# Patient Record
Sex: Female | Born: 1950 | Race: Black or African American | Hispanic: No | Marital: Single | Smoking: Former smoker
Health system: Southern US, Community
[De-identification: ages and names within clinical notes are randomized; demographics above are authoritative.]

## PROBLEM LIST (undated history)

## (undated) DIAGNOSIS — E785 Hyperlipidemia, unspecified: Secondary | ICD-10-CM

## (undated) DIAGNOSIS — R7303 Prediabetes: Secondary | ICD-10-CM

---

## 2014-10-27 ENCOUNTER — Ambulatory Visit: Payer: Self-pay | Admitting: Physician Assistant

## 2017-06-09 ENCOUNTER — Encounter: Payer: Self-pay | Admitting: Emergency Medicine

## 2017-06-09 ENCOUNTER — Ambulatory Visit
Admission: EM | Admit: 2017-06-09 | Discharge: 2017-06-09 | Disposition: A | Discharge status: Home / Self Care | Payer: BC Managed Care – PPO | Attending: Family Medicine | Admitting: Family Medicine

## 2017-06-09 ENCOUNTER — Ambulatory Visit
Admit: 2017-06-09 | Discharge: 2017-06-09 | Disposition: A | Discharge status: Home / Self Care | Payer: BC Managed Care – PPO | Attending: Family Medicine | Admitting: Family Medicine

## 2017-06-09 ENCOUNTER — Telehealth: Payer: Self-pay | Admitting: Emergency Medicine

## 2017-06-09 DIAGNOSIS — M79661 Pain in right lower leg: Secondary | ICD-10-CM | POA: Diagnosis not present

## 2017-06-09 DIAGNOSIS — M76891 Other specified enthesopathies of right lower limb, excluding foot: Secondary | ICD-10-CM

## 2017-06-09 DIAGNOSIS — T148XXA Other injury of unspecified body region, initial encounter: Secondary | ICD-10-CM

## 2017-06-09 DIAGNOSIS — M7989 Other specified soft tissue disorders: Secondary | ICD-10-CM | POA: Insufficient documentation

## 2017-06-09 MED ORDER — MELOXICAM 15 MG PO TABS
15.0000 mg | ORAL_TABLET | Freq: Every day | ORAL | 0 refills | Status: AC
Start: 2017-06-09 — End: ?

## 2017-06-09 MED ORDER — TIZANIDINE HCL 4 MG PO CAPS: 4.0000 mg | ORAL_CAPSULE | Freq: Three times a day (TID) | ORAL | 0 refills | Status: AC

## 2017-06-09 NOTE — Telephone Encounter (Signed)
Patient notified that her [place] was negative for DVTs.  Patient verbalized understanding.

## 2017-06-09 NOTE — ED Provider Notes (Signed)
[facility]    CSN: [id number] Arrival date & time: 06/09/17  0945     History   Chief Complaint Chief Complaint  Patient presents with  . Leg Pain    HPI Isabel Castro is a 66 y.o. female.   Patient is a 66 year old black female multiple complaints the main complaint though today is she is having pain in the right leg. She reports pain in the right hamstring area and the right popliteal area. She has multiple antidotal stories of the leg locking up on her all wants causing her to have trouble ambulating getting in and out time evening having trouble forming her job at [employer]. She reports is been going on for 2-3 weeks and is only getting worse. She does report that she will did take some time off at home when he first started she wouldn't do anything she got home but just rested but once she started doing her activities again the pain started back up again. No history of previous injury to the right leg no previous history of hamstring tendon pulled or irritation before either. No known drug allergies. She states she does not have a PCP that she sees regularly she denies any medical problems. No previous surgeries she does smoke. No pertinent family medical history relevant to today's visit.  She also shows me a picture of where she's had appears to be some pustules in the right lower leg not sure this is insect bite she received when she was gardening the main thing though is pustules. Be healed there is some mild swelling but no other abnormalities   The history is provided by the patient. No language interpreter was used.  Leg Pain  Location:  Leg Time since incident:  3 weeks Injury: no   Leg location:  R leg Pain details:    Quality:  Aching and cramping   Radiates to:  R leg and RLQ   Severity:  Moderate   Onset quality:  Sudden   Timing:  Intermittent   Progression:  Waxing and waning Chronicity:  New Dislocation: no   Foreign body present:  No foreign  bodies   History reviewed. No pertinent past medical history.  There are no active problems to display for this patient.   History reviewed. No pertinent surgical history.  OB History    No data available       Home Medications    Prior to Admission medications   Medication Sig Start Date End Date Taking? Authorizing Provider  meloxicam (MOBIC) 15 MG tablet Take 1 tablet (15 mg total) by mouth daily. 06/09/17   Frederich Cha, MD  tiZANidine (ZANAFLEX) 4 MG capsule Take 1 capsule (4 mg total) by mouth 3 (three) times daily. 06/09/17   Frederich Cha, MD    Family History History reviewed. No pertinent family history.  Social History Social History  Substance Use Topics  . Smoking status: Current Every Day Smoker    Packs/day: 0.50    Types: Cigarettes  . Smokeless tobacco: Never Used  . Alcohol use Yes     Allergies   Patient has no known allergies.   Review of Systems Review of Systems  Unable to perform ROS: Other  Musculoskeletal: Positive for joint swelling and myalgias.  Skin: Negative for color change, pallor and rash.     Physical Exam Triage Vital Signs ED Triage Vitals  Enc Vitals Group     BP 06/09/17 1156 129/78     Pulse Rate  06/09/17 1156 77     Resp 06/09/17 1156 16     Temp 06/09/17 1156 98 F (36.7 C)     Temp Source 06/09/17 1156 Oral     SpO2 06/09/17 1156 100 %     Weight --      Height 06/09/17 1154 5\' 3"  (1.6 m)     Head Circumference --      Peak Flow --      Pain Score 06/09/17 1154 10     Pain Loc --      Pain Edu? --      Excl. in [place]? --    No data found.   Updated Vital Signs BP 129/78 (BP Location: Left Arm)   Pulse 77   Temp 98 F (36.7 C) (Oral)   Resp 16   Ht 5\' 3"  (1.6 m)   SpO2 100%   Visual Acuity Right Eye Distance:   Left Eye Distance:   Bilateral Distance:    Right Eye Near:   Left Eye Near:    Bilateral Near:     Physical Exam  Constitutional: She is oriented to person, place, and time. She  appears well-developed and well-nourished.  HENT:  Head: Normocephalic and atraumatic.  Eyes: Pupils are equal, round, and reactive to light.  Neck: Normal range of motion. Neck supple.  Pulmonary/Chest: Effort normal.  Musculoskeletal: She exhibits tenderness.       Right lower leg: She exhibits tenderness.       Legs: Patient has tennis of the right lateral hamstring muscle insertion into the right lower leg she also has tenderness over the right calf muscle negative Homan pulse is intact. She also has a few small pustules on the right lower leg that may benefit from insect bite but they look very healing at this time.  Neurological: She is alert and oriented to person, place, and time.  Skin: Skin is warm.  Psychiatric: She has a normal mood and affect.  Vitals reviewed.    UC Treatments / Results  Labs (all labs ordered are listed, but only abnormal results are displayed) Labs Reviewed - No data to display  EKG  EKG Interpretation None       Radiology No results found.  Procedures Procedures (including critical care time)  Medications Ordered in UC Medications - No data to display   Initial Impression / Assessment and Plan / UC Course  I have reviewed the triage vital signs and the nursing notes.  Pertinent labs & imaging results that were available during my care of the patient were reviewed by me and considered in my medical decision making (see chart for details).     We'll send her for ultrasound of the right lower leg especially over the popliteal and the right calf muscle. Do not think this is a DVT but will make sure. We'll place on Mobic 15 mg Zanaflex 1 tablet 2 times a day work note for today and tomorrow strongly recommend she follows up with her PCP in case he may need to have some physical therapy and treatment for this area of pain and tenderness  Final Clinical Impressions(s) / UC Diagnoses   Final diagnoses:  Right calf pain  Muscle strain    Hamstring tendonitis of right thigh    New Prescriptions New Prescriptions   MELOXICAM (MOBIC) 15 MG TABLET    Take 1 tablet (15 mg total) by mouth daily.   TIZANIDINE (ZANAFLEX) 4 MG CAPSULE    Take 1  capsule (4 mg total) by mouth 3 (three) times daily.     Controlled Substance Prescriptions [place] Controlled Substance Registry consulted? Not Applicable   Frederich Cha, MD 06/09/17 1311

## 2017-06-09 NOTE — ED Triage Notes (Signed)
Patient c/o pain in her right lower leg and right knee that started 2 weeks ago.  Patient denies fall or injury.

## 2017-08-03 ENCOUNTER — Other Ambulatory Visit: Payer: Self-pay | Admitting: Family Medicine

## 2017-08-03 DIAGNOSIS — Z13820 Encounter for screening for osteoporosis: Secondary | ICD-10-CM

## 2018-01-31 ENCOUNTER — Other Ambulatory Visit: Payer: Self-pay | Admitting: Family Medicine

## 2018-01-31 DIAGNOSIS — Z1231 Encounter for screening mammogram for malignant neoplasm of breast: Secondary | ICD-10-CM

## 2018-02-06 ENCOUNTER — Ambulatory Visit
Admission: RE | Admit: 2018-02-06 | Discharge: 2018-02-06 | Disposition: A | Discharge status: Home / Self Care | Payer: BC Managed Care – PPO | Source: Ambulatory Visit | Attending: Family Medicine | Admitting: Family Medicine

## 2018-02-06 ENCOUNTER — Encounter (INDEPENDENT_AMBULATORY_CARE_PROVIDER_SITE_OTHER): Payer: Self-pay

## 2018-02-06 DIAGNOSIS — Z1231 Encounter for screening mammogram for malignant neoplasm of breast: Secondary | ICD-10-CM | POA: Diagnosis present

## 2018-10-05 ENCOUNTER — Encounter: Payer: Self-pay | Admitting: *Deleted

## 2018-10-08 ENCOUNTER — Ambulatory Visit: Payer: BC Managed Care – PPO | Admitting: Anesthesiology

## 2018-10-08 ENCOUNTER — Encounter
Admission: RE | Disposition: A | Discharge status: Home / Self Care | Payer: Self-pay | Source: Home / Self Care | Attending: Unknown Physician Specialty

## 2018-10-08 ENCOUNTER — Encounter: Payer: Self-pay | Admitting: *Deleted

## 2018-10-08 ENCOUNTER — Ambulatory Visit
Admission: RE | Admit: 2018-10-08 | Discharge: 2018-10-08 | Disposition: A | Discharge status: Home / Self Care | Payer: BC Managed Care – PPO | Attending: Unknown Physician Specialty | Admitting: Unknown Physician Specialty

## 2018-10-08 DIAGNOSIS — Z79899 Other long term (current) drug therapy: Secondary | ICD-10-CM | POA: Insufficient documentation

## 2018-10-08 DIAGNOSIS — K640 First degree hemorrhoids: Secondary | ICD-10-CM | POA: Insufficient documentation

## 2018-10-08 DIAGNOSIS — F17210 Nicotine dependence, cigarettes, uncomplicated: Secondary | ICD-10-CM | POA: Diagnosis not present

## 2018-10-08 DIAGNOSIS — R7303 Prediabetes: Secondary | ICD-10-CM | POA: Insufficient documentation

## 2018-10-08 DIAGNOSIS — Z7951 Long term (current) use of inhaled steroids: Secondary | ICD-10-CM | POA: Insufficient documentation

## 2018-10-08 DIAGNOSIS — D125 Benign neoplasm of sigmoid colon: Secondary | ICD-10-CM | POA: Insufficient documentation

## 2018-10-08 DIAGNOSIS — E785 Hyperlipidemia, unspecified: Secondary | ICD-10-CM | POA: Insufficient documentation

## 2018-10-08 DIAGNOSIS — Z1211 Encounter for screening for malignant neoplasm of colon: Secondary | ICD-10-CM | POA: Diagnosis not present

## 2018-10-08 HISTORY — DX: Hyperlipidemia, unspecified: E78.5

## 2018-10-08 HISTORY — DX: Prediabetes: R73.03

## 2018-10-08 HISTORY — PX: COLONOSCOPY WITH PROPOFOL: SHX5780

## 2018-10-08 SURGERY — COLONOSCOPY WITH PROPOFOL
Anesthesia: General

## 2018-10-08 MED ORDER — FENTANYL CITRATE (PF) 100 MCG/2ML IJ SOLN
INTRAMUSCULAR | Status: AC
  Filled 2018-10-08: qty 2

## 2018-10-08 MED ORDER — SODIUM CHLORIDE 0.9 % IV SOLN
INTRAVENOUS | Status: DC
  Administered 2018-10-08 (×2): via INTRAVENOUS

## 2018-10-08 MED ORDER — PROPOFOL 10 MG/ML IV BOLUS
INTRAVENOUS | Status: AC
  Filled 2018-10-08: qty 20

## 2018-10-08 MED ORDER — PROPOFOL 500 MG/50ML IV EMUL
INTRAVENOUS | Status: DC | PRN
  Administered 2018-10-08: 140 ug/kg/min via INTRAVENOUS

## 2018-10-08 MED ORDER — MIDAZOLAM HCL 2 MG/2ML IJ SOLN
INTRAMUSCULAR | Status: AC
  Filled 2018-10-08: qty 2

## 2018-10-08 MED ORDER — MIDAZOLAM HCL 2 MG/2ML IJ SOLN
INTRAMUSCULAR | Status: DC | PRN
  Administered 2018-10-08: 2 mg via INTRAVENOUS

## 2018-10-08 MED ORDER — FENTANYL CITRATE (PF) 100 MCG/2ML IJ SOLN
INTRAMUSCULAR | Status: DC | PRN
  Administered 2018-10-08: 50 ug via INTRAVENOUS

## 2018-10-08 MED ORDER — PROPOFOL 500 MG/50ML IV EMUL
INTRAVENOUS | Status: AC
  Filled 2018-10-08: qty 50

## 2018-10-08 NOTE — Anesthesia Procedure Notes (Signed)
Date/Time: 10/08/2018 2:57 PM Performed by: Nelda Marseille, CRNA Pre-anesthesia Checklist: Patient identified, Emergency Drugs available, Suction available, Patient being monitored and Timeout performed Oxygen Delivery Method: Nasal cannula

## 2018-10-08 NOTE — H&P (Signed)
Primary Care Physician:  Zeb Comfort, MD Primary Gastroenterologist:  Dr. Vira Agar  Pre-Procedure History & Physical: HPI:  Isabel Castro is a 68 y.o. female is here for an colonoscopy.   Past Medical History:  Diagnosis Date  . Hyperlipidemia   . Pre-diabetes     History reviewed. No pertinent surgical history.  Prior to Admission medications   Medication Sig Start Date End Date Taking? Authorizing Provider  atorvastatin (LIPITOR) 10 MG tablet Take 10 mg by mouth daily.   Yes [provider]  Cyanocobalamin (VITAMIN B 12) 500 MCG TABS Take 500 mg by mouth daily.   Yes [provider]  Multiple Vitamin (MULTIVITAMIN) tablet Take 1 tablet by mouth daily.   Yes [provider]  nicotine (NICODERM CQ - DOSED IN MG/24 HOURS) 21 mg/24hr patch Place 21 mg onto the skin daily.   Yes [provider]  erythromycin ophthalmic ointment Place 1 application into the left eye at bedtime.    [provider]  fluticasone (FLONASE) 50 MCG/ACT nasal spray Place 2 sprays into both nostrils 2 (two) times daily.    [provider]  meloxicam (MOBIC) 15 MG tablet Take 1 tablet (15 mg total) by mouth daily. Patient not taking: Reported on 10/08/2018 06/09/17   Frederich Cha, MD  olopatadine (PATANOL) 0.1 % ophthalmic solution Place 1 drop into both eyes 2 (two) times daily.    [provider]  tiZANidine (ZANAFLEX) 4 MG capsule Take 1 capsule (4 mg total) by mouth 3 (three) times daily. Patient not taking: Reported on 10/08/2018 06/09/17   Frederich Cha, MD    Allergies as of 08/15/2018  . (No Known Allergies)    Family History  Problem Relation Age of Onset  . Breast cancer Neg Hx     Social History   Socioeconomic History  . Marital status: Single    Spouse name: Not on file  . Number of children: Not on file  . Years of education: Not on file  . Highest education level: Not on file  Occupational History  . Not on file   Social Needs  . Financial resource strain: Not on file  . Food insecurity:    Worry: Not on file    Inability: Not on file  . Transportation needs:    Medical: Not on file    Non-medical: Not on file  Tobacco Use  . Smoking status: Current Every Day Smoker    Packs/day: 1.00    Years: 15.00    Pack years: 15.00    Types: Cigarettes  . Smokeless tobacco: Never Used  Substance and Sexual Activity  . Alcohol use: Yes  . Drug use: Never  . Sexual activity: Not on file  Lifestyle  . Physical activity:    Days per week: Not on file    Minutes per session: Not on file  . Stress: Not on file  Relationships  . Social connections:    Talks on phone: Not on file    Gets together: Not on file    Attends religious service: Not on file    Active member of club or organization: Not on file    Attends meetings of clubs or organizations: Not on file    Relationship status: Not on file  . Intimate partner violence:    Fear of current or ex partner: Not on file    Emotionally abused: Not on file    Physically abused: Not on file    Forced  sexual activity: Not on file  Other Topics Concern  . Not on file  Social History Narrative  . Not on file    Review of Systems: See HPI, otherwise negative ROS  Physical Exam: BP (!) 129/91   Pulse 81   Temp 98.2 F (36.8 C) (Tympanic)   Resp 18   Ht 5\' 3"  (1.6 m)   Wt 77.1 kg   SpO2 100%   BMI 30.11 kg/m  General:   Alert,  pleasant and cooperative in NAD Head:  Normocephalic and atraumatic. Neck:  Supple; no masses or thyromegaly. Lungs:  Clear throughout to auscultation.    Heart:  Regular rate and rhythm. Abdomen:  Soft, nontender and nondistended. Normal bowel sounds, without guarding, and without rebound.   Neurologic:  Alert and  oriented x4;  grossly normal neurologically.  Impression/Plan: Isabel Castro is here for an colonoscopy to be performed for colon cancer screening.  Risks, benefits, limitations, and  alternatives regarding  colonoscopy have been reviewed with the patient.  Questions have been answered.  All parties agreeable.   Gaylyn Cheers, MD  10/08/2018, 2:42 PM

## 2018-10-08 NOTE — Anesthesia Post-op Follow-up Note (Signed)
Anesthesia QCDR form completed.        

## 2018-10-08 NOTE — Anesthesia Postprocedure Evaluation (Signed)
Anesthesia Post Note  Patient: Isabel Castro  Procedure(s) Performed: COLONOSCOPY WITH PROPOFOL (N/A )  Patient location during evaluation: Endoscopy Anesthesia Type: General Level of consciousness: awake and alert Pain management: pain level controlled Vital Signs Assessment: post-procedure vital signs reviewed and stable Respiratory status: spontaneous breathing, nonlabored ventilation, respiratory function stable and patient connected to nasal cannula oxygen Cardiovascular status: blood pressure returned to baseline and stable Postop Assessment: no apparent nausea or vomiting Anesthetic complications: no     Last Vitals:  Vitals:   10/08/18 1529 10/08/18 1539  BP:  (!) 118/94  Pulse:    Resp: 18   Temp:    SpO2:      Last Pain:  Vitals:   10/08/18 1539  TempSrc:   PainSc: 0-No pain                 [name]

## 2018-10-08 NOTE — Transfer of Care (Signed)
Immediate Anesthesia Transfer of Care Note  Patient: Isabel Castro  Procedure(s) Performed: COLONOSCOPY WITH PROPOFOL (N/A )  Patient Location: PACU  Anesthesia Type:General  Level of Consciousness: sedated  Airway & Oxygen Therapy: Patient Spontanous Breathing and Patient connected to nasal cannula oxygen  Post-op Assessment: Report given to RN and Post -op Vital signs reviewed and stable  Post vital signs: Reviewed and stable  Last Vitals:  Vitals Value Taken Time  BP 91/67 10/08/2018  3:20 PM  Temp 36.3 C 10/08/2018  3:19 PM  Pulse 84 10/08/2018  3:21 PM  Resp 17 10/08/2018  3:21 PM  SpO2 96 % 10/08/2018  3:21 PM  Vitals shown include unvalidated device data.  Last Pain:  Vitals:   10/08/18 1519  TempSrc: Tympanic  PainSc: Asleep         Complications: No apparent anesthesia complications

## 2018-10-08 NOTE — Anesthesia Preprocedure Evaluation (Signed)
Anesthesia Evaluation  Patient identified by MRN, date of birth, ID band Patient awake    Reviewed: Allergy & Precautions, H&P , NPO status , reviewed documented beta blocker date and time   Airway Mallampati: II  TM Distance: >3 FB Neck ROM: full    Dental  (+) Caps   Pulmonary Current Smoker,    Pulmonary exam normal        Cardiovascular Normal cardiovascular exam     Neuro/Psych    GI/Hepatic   Endo/Other    Renal/GU      Musculoskeletal   Abdominal   Peds  Hematology   Anesthesia Other Findings Past Medical History: Elevated Rheumatoid Factor No date: Hyperlipidemia No date: Pre-diabetes  History reviewed. No pertinent surgical history.  BMI    Body Mass Index:  30.11 kg/m      Reproductive/Obstetrics                             Anesthesia Physical Anesthesia Plan  ASA: III  Anesthesia Plan: General   Post-op Pain Management:    Induction: Intravenous  PONV Risk Score and Plan: Treatment may vary due to age or medical condition and TIVA  Airway Management Planned: Nasal Cannula and Natural Airway  Additional Equipment:   Intra-op Plan:   Post-operative Plan:   Informed Consent: I have reviewed the patients History and Physical, chart, labs and discussed the procedure including the risks, benefits and alternatives for the proposed anesthesia with the patient or authorized representative who has indicated his/her understanding and acceptance.   Dental Advisory Given  Plan Discussed with: CRNA  Anesthesia Plan Comments:         Anesthesia Quick Evaluation

## 2018-10-08 NOTE — Op Note (Signed)
[facility] Patient Name: Isabel Castro Procedure Date: 10/08/2018 2:40 PM MRN: [id number] Account #: [id number] Date of Birth: [birth year] Admit Type: Outpatient Age: 68 Room: [facility] ENDO ROOM 1 Gender: Female Note Status: Finalized Procedure:            Colonoscopy Indications:          Screening for colorectal malignant neoplasm Providers:            Manya Silvas, MD Referring MD:         Mcneil Sober, MD Medicines:            Propofol per Anesthesia Complications:        No immediate complications. Procedure:            Pre-Anesthesia Assessment:                       - After reviewing the risks and benefits, the patient                        was deemed in satisfactory condition to undergo the                        procedure.                       After obtaining informed consent, the colonoscope was                        passed under direct vision. Throughout the procedure,                        the patient's blood pressure, pulse, and oxygen                        saturations were monitored continuously. The                        Colonoscope was introduced through the anus and                        advanced to the the cecum, identified by appendiceal                        orifice and ileocecal valve. The colonoscopy was                        somewhat difficult due to a tortuous colon. The patient                        tolerated the procedure well. The quality of the bowel                        preparation was good. Findings:      A small polyp was found in the sigmoid colon. The polyp was sessile. The       polyp was removed with a hot snare. Resection and retrieval were       complete.      Internal hemorrhoids were found during endoscopy. The hemorrhoids were       small and Grade I (internal hemorrhoids that do not prolapse).      The exam  was otherwise without abnormality. Impression:           - One small polyp in the  sigmoid colon, removed with a                        hot snare. Resected and retrieved.                       - Internal hemorrhoids.                       - The examination was otherwise normal. Recommendation:       - Await pathology results. Manya Silvas, MD 10/08/2018 3:17:55 PM This report has been signed electronically. Number of Addenda: 0 Note Initiated On: 10/08/2018 2:40 PM Scope Withdrawal Time: 0 hours 5 minutes 58 seconds  Total Procedure Duration: 0 hours 19 minutes 44 seconds       [facility]

## 2018-10-10 LAB — SURGICAL PATHOLOGY

## 2019-07-09 ENCOUNTER — Other Ambulatory Visit: Payer: Self-pay | Admitting: Family Medicine

## 2019-07-09 DIAGNOSIS — Z1231 Encounter for screening mammogram for malignant neoplasm of breast: Secondary | ICD-10-CM

## 2019-07-23 ENCOUNTER — Ambulatory Visit
Admission: RE | Admit: 2019-07-23 | Discharge: 2019-07-23 | Disposition: A | Discharge status: Home / Self Care | Payer: BC Managed Care – PPO | Source: Ambulatory Visit | Attending: Family Medicine | Admitting: Family Medicine

## 2019-07-23 ENCOUNTER — Other Ambulatory Visit: Payer: Self-pay

## 2019-07-23 DIAGNOSIS — Z1231 Encounter for screening mammogram for malignant neoplasm of breast: Secondary | ICD-10-CM | POA: Insufficient documentation

## 2020-05-25 ENCOUNTER — Other Ambulatory Visit: Payer: Self-pay | Admitting: Gerontology

## 2020-05-25 DIAGNOSIS — Z13820 Encounter for screening for osteoporosis: Secondary | ICD-10-CM

## 2020-07-09 ENCOUNTER — Emergency Department: Payer: Medicare Other | Admitting: Anesthesiology

## 2020-07-09 ENCOUNTER — Encounter: Admission: EM | Disposition: E | Payer: Self-pay | Source: Home / Self Care | Attending: Vascular Surgery

## 2020-07-09 ENCOUNTER — Encounter: Payer: Self-pay | Admitting: Emergency Medicine

## 2020-07-09 ENCOUNTER — Emergency Department: Payer: Medicare Other

## 2020-07-09 ENCOUNTER — Other Ambulatory Visit (INDEPENDENT_AMBULATORY_CARE_PROVIDER_SITE_OTHER): Payer: Self-pay | Admitting: Vascular Surgery

## 2020-07-09 ENCOUNTER — Other Ambulatory Visit: Payer: Self-pay

## 2020-07-09 ENCOUNTER — Inpatient Hospital Stay
Admission: EM | Admit: 2020-07-09 | Discharge: 2020-08-03 | DRG: 268 | Disposition: E | Payer: Medicare Other | Attending: Vascular Surgery | Admitting: Vascular Surgery

## 2020-07-09 DIAGNOSIS — Z791 Long term (current) use of non-steroidal anti-inflammatories (NSAID): Secondary | ICD-10-CM

## 2020-07-09 DIAGNOSIS — G4733 Obstructive sleep apnea (adult) (pediatric): Secondary | ICD-10-CM | POA: Diagnosis present

## 2020-07-09 DIAGNOSIS — N170 Acute kidney failure with tubular necrosis: Secondary | ICD-10-CM | POA: Diagnosis not present

## 2020-07-09 DIAGNOSIS — I469 Cardiac arrest, cause unspecified: Secondary | ICD-10-CM | POA: Diagnosis not present

## 2020-07-09 DIAGNOSIS — R34 Anuria and oliguria: Secondary | ICD-10-CM | POA: Diagnosis not present

## 2020-07-09 DIAGNOSIS — J9602 Acute respiratory failure with hypercapnia: Secondary | ICD-10-CM | POA: Diagnosis not present

## 2020-07-09 DIAGNOSIS — I713 Abdominal aortic aneurysm, ruptured, unspecified: Secondary | ICD-10-CM

## 2020-07-09 DIAGNOSIS — Z992 Dependence on renal dialysis: Secondary | ICD-10-CM

## 2020-07-09 DIAGNOSIS — K559 Vascular disorder of intestine, unspecified: Secondary | ICD-10-CM | POA: Diagnosis not present

## 2020-07-09 DIAGNOSIS — E875 Hyperkalemia: Secondary | ICD-10-CM | POA: Diagnosis not present

## 2020-07-09 DIAGNOSIS — Z452 Encounter for adjustment and management of vascular access device: Secondary | ICD-10-CM

## 2020-07-09 DIAGNOSIS — E162 Hypoglycemia, unspecified: Secondary | ICD-10-CM

## 2020-07-09 DIAGNOSIS — E785 Hyperlipidemia, unspecified: Secondary | ICD-10-CM | POA: Diagnosis present

## 2020-07-09 DIAGNOSIS — Z6832 Body mass index (BMI) 32.0-32.9, adult: Secondary | ICD-10-CM

## 2020-07-09 DIAGNOSIS — Z978 Presence of other specified devices: Secondary | ICD-10-CM

## 2020-07-09 DIAGNOSIS — Z20822 Contact with and (suspected) exposure to covid-19: Secondary | ICD-10-CM | POA: Diagnosis present

## 2020-07-09 DIAGNOSIS — R58 Hemorrhage, not elsewhere classified: Secondary | ICD-10-CM

## 2020-07-09 DIAGNOSIS — E11649 Type 2 diabetes mellitus with hypoglycemia without coma: Secondary | ICD-10-CM | POA: Diagnosis not present

## 2020-07-09 DIAGNOSIS — E872 Acidosis, unspecified: Secondary | ICD-10-CM

## 2020-07-09 DIAGNOSIS — I714 Abdominal aortic aneurysm, without rupture, unspecified: Secondary | ICD-10-CM | POA: Diagnosis present

## 2020-07-09 DIAGNOSIS — D62 Acute posthemorrhagic anemia: Secondary | ICD-10-CM

## 2020-07-09 DIAGNOSIS — Z87891 Personal history of nicotine dependence: Secondary | ICD-10-CM

## 2020-07-09 DIAGNOSIS — E1165 Type 2 diabetes mellitus with hyperglycemia: Secondary | ICD-10-CM | POA: Diagnosis not present

## 2020-07-09 DIAGNOSIS — Z66 Do not resuscitate: Secondary | ICD-10-CM | POA: Diagnosis not present

## 2020-07-09 DIAGNOSIS — R578 Other shock: Secondary | ICD-10-CM | POA: Diagnosis not present

## 2020-07-09 DIAGNOSIS — Z7189 Other specified counseling: Secondary | ICD-10-CM

## 2020-07-09 DIAGNOSIS — K661 Hemoperitoneum: Secondary | ICD-10-CM | POA: Diagnosis present

## 2020-07-09 DIAGNOSIS — K7200 Acute and subacute hepatic failure without coma: Secondary | ICD-10-CM | POA: Diagnosis not present

## 2020-07-09 DIAGNOSIS — N179 Acute kidney failure, unspecified: Secondary | ICD-10-CM

## 2020-07-09 DIAGNOSIS — J9601 Acute respiratory failure with hypoxia: Secondary | ICD-10-CM | POA: Diagnosis not present

## 2020-07-09 DIAGNOSIS — G928 Other toxic encephalopathy: Secondary | ICD-10-CM | POA: Diagnosis not present

## 2020-07-09 DIAGNOSIS — E870 Hyperosmolality and hypernatremia: Secondary | ICD-10-CM | POA: Diagnosis not present

## 2020-07-09 DIAGNOSIS — I10 Essential (primary) hypertension: Secondary | ICD-10-CM | POA: Diagnosis present

## 2020-07-09 DIAGNOSIS — R6521 Severe sepsis with septic shock: Secondary | ICD-10-CM | POA: Diagnosis not present

## 2020-07-09 DIAGNOSIS — Z79899 Other long term (current) drug therapy: Secondary | ICD-10-CM

## 2020-07-09 DIAGNOSIS — A419 Sepsis, unspecified organism: Secondary | ICD-10-CM | POA: Diagnosis not present

## 2020-07-09 DIAGNOSIS — J9600 Acute respiratory failure, unspecified whether with hypoxia or hypercapnia: Secondary | ICD-10-CM

## 2020-07-09 DIAGNOSIS — G9340 Encephalopathy, unspecified: Secondary | ICD-10-CM

## 2020-07-09 LAB — URINALYSIS, COMPLETE (UACMP) WITH MICROSCOPIC
Bacteria, UA: NONE SEEN
Bilirubin Urine: NEGATIVE
Glucose, UA: NEGATIVE mg/dL
Hgb urine dipstick: NEGATIVE
Ketones, ur: NEGATIVE mg/dL
Leukocytes,Ua: NEGATIVE
Nitrite: NEGATIVE
Protein, ur: 30 mg/dL — AB
Specific Gravity, Urine: 1.023 (ref 1.005–1.030)
pH: 5 (ref 5.0–8.0)

## 2020-07-09 LAB — COMPREHENSIVE METABOLIC PANEL
ALT: 19 U/L (ref 0–44)
AST: 28 U/L (ref 15–41)
Albumin: 3.8 g/dL (ref 3.5–5.0)
Alkaline Phosphatase: 46 U/L (ref 38–126)
Anion gap: 18 — ABNORMAL HIGH (ref 5–15)
BUN: 30 mg/dL — ABNORMAL HIGH (ref 8–23)
CO2: 18 mmol/L — ABNORMAL LOW (ref 22–32)
Calcium: 8.9 mg/dL (ref 8.9–10.3)
Chloride: 104 mmol/L (ref 98–111)
Creatinine, Ser: 1.83 mg/dL — ABNORMAL HIGH (ref 0.44–1.00)
GFR calc non Af Amer: 28 mL/min — ABNORMAL LOW (ref >60)
Glucose, Bld: 184 mg/dL — ABNORMAL HIGH (ref 70–99)
Potassium: 4.1 mmol/L (ref 3.5–5.1)
Sodium: 140 mmol/L (ref 135–145)
Total Bilirubin: 0.8 mg/dL (ref 0.3–1.2)
Total Protein: 7 g/dL (ref 6.5–8.1)

## 2020-07-09 LAB — CBC
HCT: 31.4 % — ABNORMAL LOW (ref 36.0–46.0)
Hemoglobin: 9.7 g/dL — ABNORMAL LOW (ref 12.0–15.0)
MCH: 28 pg (ref 26.0–34.0)
MCHC: 30.9 g/dL (ref 30.0–36.0)
MCV: 90.8 fL (ref 80.0–100.0)
Platelets: 215 10*3/uL (ref 150–400)
RBC: 3.46 MIL/uL — ABNORMAL LOW (ref 3.87–5.11)
RDW: 14.1 % (ref 11.5–15.5)
WBC: 10.2 10*3/uL (ref 4.0–10.5)
nRBC: 0 % (ref 0.0–0.2)

## 2020-07-09 LAB — PROTIME-INR
INR: 1.2 (ref 0.8–1.2)
Prothrombin Time: 14.8 seconds (ref 11.4–15.2)

## 2020-07-09 LAB — LACTIC ACID, PLASMA
Lactic Acid, Venous: 6.5 mmol/L (ref 0.5–1.9)
Lactic Acid, Venous: 9.1 mmol/L (ref 0.5–1.9)

## 2020-07-09 LAB — ABO/RH: ABO/RH(D): A POS

## 2020-07-09 LAB — LIPASE, BLOOD: Lipase: 40 U/L (ref 11–51)

## 2020-07-09 LAB — APTT: aPTT: 29 seconds (ref 24–36)

## 2020-07-09 SURGERY — INSERTION, ENDOVASCULAR STENT GRAFT, AORTA, ABDOMINAL
Anesthesia: General

## 2020-07-09 MED ORDER — ONDANSETRON 4 MG PO TBDP: 4.0000 mg | ORAL_TABLET | Freq: Once | ORAL | Status: DC | PRN

## 2020-07-09 MED ORDER — CHLORHEXIDINE GLUCONATE CLOTH 2 % EX PADS: 6.0000 | MEDICATED_PAD | Freq: Once | CUTANEOUS | Status: DC

## 2020-07-09 MED ORDER — MORPHINE SULFATE (PF) 2 MG/ML IV SOLN
2.0000 mg | INTRAVENOUS | Status: DC | PRN
  Administered 2020-07-09: 2 mg via INTRAVENOUS
  Filled 2020-07-09: qty 1

## 2020-07-09 MED ORDER — ONDANSETRON HCL 4 MG/2ML IJ SOLN
INTRAMUSCULAR | Status: AC
  Administered 2020-07-09: 4 mg via INTRAVENOUS
  Filled 2020-07-09: qty 2

## 2020-07-09 MED ORDER — CEFAZOLIN SODIUM-DEXTROSE 1-4 GM/50ML-% IV SOLN: 1.0000 g | INTRAVENOUS | Status: DC

## 2020-07-09 MED ORDER — SODIUM CHLORIDE 0.9 % IV SOLN: 10.0000 mL/h | Freq: Once | INTRAVENOUS | Status: DC

## 2020-07-09 MED ORDER — ONDANSETRON HCL 4 MG/2ML IJ SOLN
INTRAMUSCULAR | Status: AC
  Filled 2020-07-09: qty 2

## 2020-07-09 MED ORDER — SODIUM CHLORIDE 0.9 % IV SOLN: INTRAVENOUS | Status: DC

## 2020-07-09 MED ORDER — PHENYLEPHRINE HCL (PRESSORS) 10 MG/ML IV SOLN
INTRAVENOUS | Status: AC
  Filled 2020-07-09: qty 1

## 2020-07-09 MED ORDER — IOHEXOL 9 MG/ML PO SOLN
1000.0000 mL | Freq: Once | ORAL | Status: AC | PRN
  Administered 2020-07-09: 500 mL via ORAL

## 2020-07-09 MED ORDER — CEFAZOLIN SODIUM-DEXTROSE 1-4 GM/50ML-% IV SOLN: 1.0000 g | Freq: Once | INTRAVENOUS | Status: DC

## 2020-07-09 MED ORDER — SUCCINYLCHOLINE CHLORIDE 200 MG/10ML IV SOSY
PREFILLED_SYRINGE | INTRAVENOUS | Status: AC
  Filled 2020-07-09: qty 10

## 2020-07-09 MED ORDER — FENTANYL CITRATE (PF) 100 MCG/2ML IJ SOLN
INTRAMUSCULAR | Status: AC
  Filled 2020-07-09: qty 2

## 2020-07-09 MED ORDER — ONDANSETRON HCL 4 MG/2ML IJ SOLN: 4.0000 mg | Freq: Once | INTRAMUSCULAR | Status: AC

## 2020-07-09 MED ORDER — LACTATED RINGERS IV BOLUS
1000.0000 mL | Freq: Once | INTRAVENOUS | Status: AC
  Administered 2020-07-09: 1000 mL via INTRAVENOUS

## 2020-07-09 MED ORDER — ROCURONIUM BROMIDE 10 MG/ML (PF) SYRINGE
PREFILLED_SYRINGE | INTRAVENOUS | Status: AC
  Filled 2020-07-09: qty 10

## 2020-07-09 MED ORDER — DEXAMETHASONE SODIUM PHOSPHATE 10 MG/ML IJ SOLN
INTRAMUSCULAR | Status: AC
  Filled 2020-07-09: qty 1

## 2020-07-09 MED ORDER — ONDANSETRON HCL 4 MG/2ML IJ SOLN
4.0000 mg | Freq: Once | INTRAMUSCULAR | Status: AC
  Administered 2020-07-09: 4 mg via INTRAVENOUS
  Filled 2020-07-09: qty 2

## 2020-07-09 SURGICAL SUPPLY — 58 items
APPLIER CLIP 11 MED OPEN (CLIP)
APPLIER CLIP 13 LRG OPEN (CLIP)
APPLIER CLIP 9.375 SM OPEN (CLIP)
BAG DECANTER FOR FLEXI CONT (MISCELLANEOUS) ×3 IMPLANT
BLADE SURG 15 STRL LF DISP TIS (BLADE) ×1 IMPLANT
BLADE SURG 15 STRL SS (BLADE) ×2
BLADE SURG SZ11 CARB STEEL (BLADE) ×3 IMPLANT
BOOT SUTURE AID YELLOW STND (SUTURE) ×3 IMPLANT
BRUSH SCRUB EZ  4% CHG (MISCELLANEOUS) ×2
BRUSH SCRUB EZ 4% CHG (MISCELLANEOUS) ×1 IMPLANT
CLIP APPLIE 11 MED OPEN (CLIP) IMPLANT
CLIP APPLIE 13 LRG OPEN (CLIP) IMPLANT
CLIP APPLIE 9.375 SM OPEN (CLIP) IMPLANT
COVER WAND RF STERILE (DRAPES) IMPLANT
DERMABOND ADVANCED (GAUZE/BANDAGES/DRESSINGS) ×2
DERMABOND ADVANCED .7 DNX12 (GAUZE/BANDAGES/DRESSINGS) ×1 IMPLANT
ELECT CAUTERY BLADE 6.4 (BLADE) ×3 IMPLANT
ELECT REM PT RETURN 9FT ADLT (ELECTROSURGICAL) ×3
ELECTRODE REM PT RTRN 9FT ADLT (ELECTROSURGICAL) ×1 IMPLANT
GAUZE 4X4 16PLY RFD (DISPOSABLE) IMPLANT
GLOVE BIO SURGEON STRL SZ7 (GLOVE) ×3 IMPLANT
GLOVE INDICATOR 7.5 STRL GRN (GLOVE) ×3 IMPLANT
GOWN STRL REUS W/ TWL LRG LVL3 (GOWN DISPOSABLE) ×2 IMPLANT
GOWN STRL REUS W/ TWL XL LVL3 (GOWN DISPOSABLE) ×2 IMPLANT
GOWN STRL REUS W/TWL LRG LVL3 (GOWN DISPOSABLE) ×4
GOWN STRL REUS W/TWL XL LVL3 (GOWN DISPOSABLE) ×4
HEMOSTAT SURGICEL 2X3 (HEMOSTASIS) IMPLANT
IV NS 1000ML (IV SOLUTION) ×2
IV NS 1000ML BAXH (IV SOLUTION) ×1 IMPLANT
LABEL OR SOLS (LABEL) ×3 IMPLANT
LOOP RED MAXI  1X406MM (MISCELLANEOUS) ×2
LOOP VESSEL MAXI 1X406 RED (MISCELLANEOUS) ×1 IMPLANT
LOOP VESSEL MINI 0.8X406 BLUE (MISCELLANEOUS) ×1 IMPLANT
LOOPS BLUE MINI 0.8X406MM (MISCELLANEOUS) ×2
NDL SAFETY ECLIPSE 18X1.5 (NEEDLE) IMPLANT
NEEDLE HYPO 18GX1.5 SHARP (NEEDLE)
NEEDLE HYPO 25X1 1.5 SAFETY (NEEDLE) IMPLANT
PACK BASIN MAJOR ARMC (MISCELLANEOUS) ×3 IMPLANT
PENCIL ELECTRO HAND CTR (MISCELLANEOUS) IMPLANT
SPONGE LAP 18X18 RF (DISPOSABLE) IMPLANT
SUT MNCRL 4-0 (SUTURE) ×2
SUT MNCRL 4-0 27XMFL (SUTURE) ×1
SUT PROLENE 5 0 RB 1 DA (SUTURE) IMPLANT
SUT PROLENE 6 0 BV (SUTURE) IMPLANT
SUT SILK 2 0 (SUTURE)
SUT SILK 2-0 18XBRD TIE 12 (SUTURE) IMPLANT
SUT SILK 3 0 (SUTURE)
SUT SILK 3-0 18XBRD TIE 12 (SUTURE) IMPLANT
SUT SILK 4 0 (SUTURE)
SUT SILK 4-0 18XBRD TIE 12 (SUTURE) IMPLANT
SUT VIC AB 2-0 CT1 (SUTURE) IMPLANT
SUT VICRYL+ 3-0 36IN CT-1 (SUTURE) IMPLANT
SUTURE MNCRL 4-0 27XMF (SUTURE) ×1 IMPLANT
SYR 10ML LL (SYRINGE) IMPLANT
SYR 20ML LL LF (SYRINGE) ×3 IMPLANT
SYR 3ML LL SCALE MARK (SYRINGE) IMPLANT
SYR BULB IRRIG 60ML STRL (SYRINGE) IMPLANT
TOWEL OR 17X26 4PK STRL BLUE (TOWEL DISPOSABLE) IMPLANT

## 2020-07-09 NOTE — ED Notes (Signed)
Lab at bedside to draw type and screen

## 2020-07-09 NOTE — Anesthesia Preprocedure Evaluation (Signed)
Anesthesia Evaluation  Patient identified by MRN, date of birth, ID band Patient awake    Reviewed: Allergy & Precautions, H&P , NPO status , reviewed documented beta blocker date and time   Airway Mallampati: II  TM Distance: >3 FB Neck ROM: full    Dental  (+) Caps   Pulmonary Current Smoker and Patient abstained from smoking., former smoker,    Pulmonary exam normal        Cardiovascular + Peripheral Vascular Disease  Normal cardiovascular exam     Neuro/Psych negative neurological ROS  negative psych ROS   GI/Hepatic negative GI ROS, Neg liver ROS,   Endo/Other  diabetes  Renal/GU negative Renal ROS  negative genitourinary   Musculoskeletal negative musculoskeletal ROS (+)   Abdominal   Peds negative pediatric ROS (+)  Hematology negative hematology ROS (+)   Anesthesia Other Findings Past Medical History: Elevated Rheumatoid Factor No date: Hyperlipidemia No date: Pre-diabetes  History reviewed. No pertinent surgical history.  BMI    Body Mass Index:  30.11 kg/m      Reproductive/Obstetrics                             Anesthesia Physical  Anesthesia Plan  ASA: III and emergent  Anesthesia Plan: General   Post-op Pain Management:    Induction: Intravenous, Rapid sequence and Cricoid pressure planned  PONV Risk Score and Plan:   Airway Management Planned: Oral ETT  Additional Equipment:   Intra-op Plan:   Post-operative Plan: Possible Post-op intubation/ventilation and Extubation in OR  Informed Consent: I have reviewed the patients History and Physical, chart, labs and discussed the procedure including the risks, benefits and alternatives for the proposed anesthesia with the patient or authorized representative who has indicated his/her understanding and acceptance.     Dental Advisory Given  Plan Discussed with: CRNA  Anesthesia Plan Comments:          Anesthesia Quick Evaluation

## 2020-07-09 NOTE — ED Notes (Signed)
Awaiting Dr. Lucky Cowboy  PT has been covid swabbed. OR aware results will not be back for surgery. Will leave for vascular as soon as Dr. Lucky Cowboy arrives

## 2020-07-09 NOTE — ED Notes (Signed)
Patient transported to CT 

## 2020-07-09 NOTE — ED Triage Notes (Signed)
Pt to ED via EMS from home c/o abd pain that started this morning when she woke up, denies n/v/d at first, denies fevers, last ate yesterday.  Pt wobbling, having trouble concentrating, vomiting in triage at this time.  Pt was trying to use triage restroom and when checked on she was lying on a blanket on the ground, states she laid herself down, denies LOC or hitting head.

## 2020-07-09 NOTE — ED Notes (Signed)
Vascular PA at bedside speaking with pt

## 2020-07-09 NOTE — ED Triage Notes (Signed)
First nurse note- here for abdominal pain to lower abdomen. VSS with EMS. CBG 136

## 2020-07-09 NOTE — ED Notes (Signed)
Dr. Lucky Cowboy at bedside [id number]

## 2020-07-09 NOTE — ED Provider Notes (Signed)
[facility] Emergency Department Provider Note   ____________________________________________   First MD Initiated Contact with Patient 07/13/2020 1951     (approximate)  I have reviewed the triage vital signs and the nursing notes.   HISTORY  Chief Complaint Abdominal Pain    HPI Isabel Castro is a 69 y.o. female with a stated past medical history of prediabetes and hyperlipidemia who presents for worsening abdominal pain that began approximately 8 hours prior to arrival and is associated with nausea/vomiting.  Patient states that this pain is approximately 9/10 in severity and radiates through to her back.  Patient denies any exacerbating or relieving factors for this pain.  Patient has not tried any medications for the symptoms         Past Medical History:  Diagnosis Date  . Hyperlipidemia   . Pre-diabetes     There are no problems to display for this patient.   Past Surgical History:  Procedure Laterality Date  . COLONOSCOPY WITH PROPOFOL N/A 10/08/2018   Procedure: COLONOSCOPY WITH PROPOFOL;  Surgeon: Manya Silvas, MD;  Location: [facility] ENDOSCOPY;  Service: Endoscopy;  Laterality: N/A;    Prior to Admission medications   Medication Sig Start Date End Date Taking? Authorizing Provider  atorvastatin (LIPITOR) 10 MG tablet Take 10 mg by mouth daily.    [provider]  Cyanocobalamin (VITAMIN B 12) 500 MCG TABS Take 500 mg by mouth daily.    [provider]  erythromycin ophthalmic ointment Place 1 application into the left eye at bedtime.    [provider]  fluticasone (FLONASE) 50 MCG/ACT nasal spray Place 2 sprays into both nostrils 2 (two) times daily.    [provider]  meloxicam (MOBIC) 15 MG tablet Take 1 tablet (15 mg total) by mouth daily. Patient not taking: Reported on 10/08/2018 06/09/17   Frederich Cha, MD  Multiple Vitamin (MULTIVITAMIN) tablet Take 1 tablet by mouth daily.    [provider]  nicotine (NICODERM CQ - DOSED IN MG/24 HOURS) 21 mg/24hr patch Place 21 mg onto the skin daily.    [provider]  olopatadine (PATANOL) 0.1 % ophthalmic solution Place 1 drop into both eyes 2 (two) times daily.    [provider]  tiZANidine (ZANAFLEX) 4 MG capsule Take 1 capsule (4 mg total) by mouth 3 (three) times daily. Patient not taking: Reported on 10/08/2018 06/09/17   Frederich Cha, MD    Allergies Patient has no known allergies.  Family History  Problem Relation Age of Onset  . Breast cancer Neg Hx     Social History Social History   Tobacco Use  . Smoking status: Former Smoker    Packs/day: 1.00    Years: 15.00    Pack years: 15.00    Types: Cigarettes  . Smokeless tobacco: Never Used  Vaping Use  . Vaping Use: Never used  Substance Use Topics  . Alcohol use: Yes  . Drug use: Never    Review of Systems Constitutional: No fever/chills Eyes: No visual changes. ENT: No sore throat. Cardiovascular: Denies chest pain. Respiratory: Denies shortness of breath. Gastrointestinal: Endorses abdominal pain.  Endorses nausea, no vomiting.  No diarrhea. Genitourinary: Negative for dysuria. Musculoskeletal: Negative for acute arthralgias Skin: Negative for rash. Neurological: Negative for headaches, weakness/numbness/paresthesias in any extremity Psychiatric: Negative for suicidal ideation/homicidal ideation   ____________________________________________   PHYSICAL EXAM:  VITAL SIGNS: ED Triage Vitals  Enc Vitals Group     BP 08/02/2020 1935  101/79     Pulse Rate 07/05/2020 2100 (!) 106     Resp 07/21/2020 1935 18     Temp 07/28/2020 1935 98.6 F (37 C)     Temp Source 07/20/2020 1935 Oral     SpO2 07/21/2020 2100 100 %     Weight 07/16/2020 1936 185 lb (83.9 kg)     Height 07/07/2020 1936 5\' 3"  (1.6 m)     Head Circumference --      Peak Flow --      Pain Score 08/01/2020 1935 8     Pain Loc --      Pain Edu? --      Excl. in [place]? --     Constitutional: Alert and oriented. Well appearing and in no acute distress. Eyes: Conjunctivae are normal. PERRL. Head: Atraumatic. Nose: No congestion/rhinnorhea. Mouth/Throat: Mucous membranes are moist. Neck: No stridor Cardiovascular: Grossly normal heart sounds.  Good peripheral circulation. Respiratory: Normal respiratory effort.  No retractions. Gastrointestinal: Distended, generalized tenderness to palpation. No distention. Musculoskeletal: No obvious deformities Neurologic:  Normal speech and language. No gross focal neurologic deficits are appreciated. Skin:  Skin is warm and dry. No rash noted. Psychiatric: Mood and affect are normal. Speech and behavior are normal.  ____________________________________________   LABS (all labs ordered are listed, but only abnormal results are displayed)  Labs Reviewed  COMPREHENSIVE METABOLIC PANEL - Abnormal; Notable for the following components:      Result Value   CO2 18 (*)    Glucose, Bld 184 (*)    BUN 30 (*)    Creatinine, Ser 1.83 (*)    GFR calc non Af Amer 28 (*)    Anion gap 18 (*)    All other components within normal limits  CBC - Abnormal; Notable for the following components:   RBC 3.46 (*)    Hemoglobin 9.7 (*)    HCT 31.4 (*)    All other components within normal limits  URINALYSIS, COMPLETE (UACMP) WITH MICROSCOPIC - Abnormal; Notable for the following components:   Color, Urine AMBER (*)    APPearance TURBID (*)    Protein, ur 30 (*)    All other components within normal limits  LACTIC ACID, PLASMA - Abnormal; Notable for the following components:   Lactic Acid, Venous 6.5 (*)    All other components within normal limits  RESPIRATORY PANEL BY PCR  LIPASE, BLOOD  LACTIC ACID, PLASMA  APTT  TYPE AND SCREEN  PREPARE RBC (CROSSMATCH)  ABO/RH  TYPE AND SCREEN   ____________________________________________  EKG  ED ECG REPORT I, Naaman Plummer, the attending physician, personally viewed and  interpreted this ECG.  Date: 07/06/2020 EKG Time: 1932 Rate: 112 Rhythm: Tachycardic sinus rhythm QRS Axis: normal Intervals: normal ST/T Wave abnormalities: normal Narrative Interpretation: no evidence of acute ischemia  ____________________________________________  RADIOLOGY  ED MD interpretation: CT of the abdomen pelvis without contrast shows approximately 7 cm AAA with rupture  Official radiology report(s): CT ABDOMEN PELVIS WO CONTRAST  Result Date: 08/01/2020 CLINICAL DATA:  Nausea, vomiting EXAM: CT ABDOMEN AND PELVIS WITHOUT CONTRAST TECHNIQUE: Multidetector CT imaging of the abdomen and pelvis was performed following the standard protocol without IV contrast. COMPARISON:  None. FINDINGS: Lower chest: There is asymmetric ground-glass pulmonary infiltrate within the right lung base, nonspecific, possibly infectious or inflammatory in nature. Superimposed minimal subpleural pulmonary fibrosis. The visualized heart and pericardium are unremarkable. Hepatobiliary: The liver is unremarkable. No intra or extrahepatic biliary ductal dilation. Layering high density  material within the gallbladder lumen likely represent small gallstones or hyperdense sludge. Pancreas: Unremarkable Spleen: Unremarkable Adrenals/Urinary Tract: There is extensive right perinephric stranding likely representing infiltration related to the retroperitoneal hemorrhage described more fully below. This obscures the right adrenal gland. Left adrenal gland is unremarkable. Kidneys are otherwise unremarkable. Bladder is unremarkable. Stomach/Bowel: The stomach, small bowel, and large bowel are unremarkable. The appendix is not clearly identified and is likely absent. Small free fluid is seen within the pelvis, nonspecific. No free intraperitoneal gas. Vascular/Lymphatic: A fusiform infrarenal abdominal aortic aneurysm is present measuring 7.3 x 7.5 x 9.5 cm in greatest dimension roughly 55 degree posterior angulation at the  proximal neck and roughly 85 degree angulation between the aneurysm sac and the right common iliac artery. There is a large amount of hyperdense material seen anteriorly and right lateral to the aneurysm sac in keeping with acute hemorrhage within the retroperitoneum secondary to aneurysm rupture. Infiltrating hemorrhagic material extends into the right anterior and posterior pararenal space and subsequently into the right hemipelvis. 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 Extensive superimposed atherosclerotic calcification within the lower extremity arterial inflow. No pathologic adenopathy within the abdomen and pelvis. Reproductive: Uterus and bilateral adnexa are unremarkable. Other: Rectum unremarkable. Musculoskeletal: No acute bone abnormality. IMPRESSION: Ruptured 7.5 cm infrarenal abdominal aortic aneurysm with extensive retroperitoneal hemorrhage. Significant angulation at the juncture of the aneurysm and right common iliac artery. Moderate angulation involving the proximal neck and body of the aneurysm. These results were called by telephone at the time of interpretation on 07/06/2020 at 10:28 pm to provider [facility] , who verbally acknowledged these results. Electronically Signed   By: Fidela Salisbury MD   On: 07/08/2020 22:30    ____________________________________________   PROCEDURES  Procedure(s) performed (including Critical Care):  .Critical Care Performed by: Naaman Plummer, MD Authorized by: Naaman Plummer, MD   Critical care provider statement:    Critical care time (minutes):  51   Critical care time was exclusive of:  Separately billable procedures and treating other patients   Critical care was necessary to treat or prevent imminent or life-threatening deterioration of the following conditions:  Circulatory failure   Critical care was time spent personally by me on the following activities:  Discussions with consultants, evaluation of  patient's response to treatment, examination of patient, ordering and performing treatments and interventions, ordering and review of laboratory studies, ordering and review of radiographic studies, pulse oximetry, re-evaluation of patient's condition, obtaining history from patient or surrogate and review of old charts   I assumed direction of critical care for this patient from another provider in my specialty: no       ____________________________________________   INITIAL IMPRESSION / ASSESSMENT AND PLAN / ED COURSE  As part of my medical decision making, I reviewed the following data within the [place] notes reviewed and incorporated, Labs reviewed, EKG interpreted, Old chart reviewed, Radiograph reviewed and Notes from prior ED visits reviewed and incorporated        Patient is a 69 year old female who presents for abdominal pain with nausea/vomiting.  Imaging shows a ruptured AAA.  Patient is hemodynamically stable throughout the emergency department course.  Consults to vascular surgery and agrees that patient will need definitive intraoperative management.  Patient typed and screened as well as crossmatch for 6 units as needed.  Analgesia as needed      ____________________________________________   FINAL CLINICAL IMPRESSION(S) / ED DIAGNOSES  Final diagnoses:  Ruptured abdominal aortic aneurysm (AAA) [facility])     ED Discharge Orders    None       Note:  This document was prepared using Dragon voice recognition software and may include unintentional dictation errors.   Naaman Plummer, MD 07/04/2020 [phone]

## 2020-07-09 NOTE — ED Notes (Signed)
Dr.  Lucky Cowboy obtaining consent

## 2020-07-09 NOTE — ED Notes (Signed)
PT taken off the  Floor at 2335 escroted by 2 RNs to vascular

## 2020-07-09 NOTE — Consult Note (Signed)
Isabel Castro  MRN : [id number]  Isabel Castro is Castro 69 y.o. (Jul 30, 1951) female who presents with chief complaint of  Chief Complaint  Patient presents with  . Abdominal Pain   History of Present Illness: Isabel Castro is Castro 69 y.o. female with Castro stated past medical history of prediabetes and hyperlipidemia who presents for worsening abdominal pain that began approximately 8 hours prior to arrival and is associated with nausea/vomiting.  Patient states that this pain is approximately 9/10 in severity and radiates through to her back.  Patient denies any exacerbating or relieving factors for this pain.  Patient has not tried any medications for the symptoms,  CT Castro/P: Ruptured 7.5cm infrarenal abdominal aortic aneurysm with extensive retroperitoneal hemorrhage. Significant angulation at the juncture of the aneurysm and right common iliac artery. Moderate angulation involving the proximal neck and body of the aneurysm.  Vascular surgery was emergently consulted by Dr. Cheri Castro.  Current Facility-Administered Medications  Medication Dose Route Frequency Provider Last Rate Last Admin  . 0.9 %  sodium chloride infusion  10 mL/hr Intravenous Once Isabel Plummer, MD      . 0.9 %  sodium chloride infusion   Intravenous Continuous Isabel Castro, Isabel Harder, PA-C      . [START ON 07/20/2020] ceFAZolin (ANCEF) IVPB 1 g/50 mL premix  1 g Intravenous Once Isabel Moody A, PA-C      . morphine 2 MG/ML injection 2 mg  2 mg Intravenous Q2H PRN Isabel Plummer, MD   2 mg at 07/21/2020 2247   Current Outpatient Medications  Medication Sig Dispense Refill  . atorvastatin (LIPITOR) 10 MG tablet Take 10 mg by mouth daily.    . Cyanocobalamin (VITAMIN B 12) 500 MCG TABS Take 500 mg by mouth daily.    Marland Kitchen ELDERBERRY PO Take by mouth.    . Multiple Vitamin (MULTIVITAMIN) tablet Take 1 tablet by mouth daily.    Marland Kitchen erythromycin ophthalmic ointment Place 1  application into the left eye at bedtime. (Patient not taking: Reported on 07/28/2020)    . fluticasone (FLONASE) 50 MCG/ACT nasal spray Place 2 sprays into both nostrils 2 (two) times daily. (Patient not taking: Reported on 08/01/2020)    . meloxicam (MOBIC) 15 MG tablet Take 1 tablet (15 mg total) by mouth daily. (Patient not taking: Reported on 10/08/2018) 30 tablet 0  . nicotine (NICODERM CQ - DOSED IN MG/24 HOURS) 21 mg/24hr patch Place 21 mg onto the skin daily. (Patient not taking: Reported on 07/26/2020)    . olopatadine (PATANOL) 0.1 % ophthalmic solution Place 1 drop into both eyes 2 (two) times daily. (Patient not taking: Reported on 07/06/2020)    . tiZANidine (ZANAFLEX) 4 MG capsule Take 1 capsule (4 mg total) by mouth 3 (three) times daily. (Patient not taking: Reported on 10/08/2018) 30 capsule 0   Past Medical History:  Diagnosis Date  . Hyperlipidemia   . Pre-diabetes    Past Surgical History:  Procedure Laterality Date  . COLONOSCOPY WITH PROPOFOL N/Castro 10/08/2018   Procedure: COLONOSCOPY WITH PROPOFOL;  Surgeon: Manya Silvas, MD;  Location: [facility] ENDOSCOPY;  Service: Endoscopy;  Laterality: N/Castro;   Social History Social History   Tobacco Use  . Smoking status: Former Smoker    Packs/day: 1.00    Years: 15.00    Pack years: 15.00    Types: Cigarettes  . Smokeless tobacco: Never Used  Vaping Use  . Vaping Use: Never used  Substance  Use Topics  . Alcohol use: Yes  . Drug use: Never   Family History Family History  Problem Relation Age of Onset  . Breast cancer Neg Hx    No Known Allergies  REVIEW OF SYSTEMS (Negative unless checked)  Constitutional: [] Weight loss  [] Fever  [] Chills Cardiac: [] Chest pain   [] Chest pressure   [] Palpitations   [] Shortness of breath when laying flat   [] Shortness of breath at rest   [] Shortness of breath with exertion. Vascular:  [] Pain in legs with walking   [] Pain in legs at rest   [] Pain in legs when laying flat   [] Claudication    [] Pain in feet when walking  [] Pain in feet at rest  [] Pain in feet when laying flat   [] History of DVT   [] Phlebitis   [] Swelling in legs   [] Varicose veins   [] Non-healing ulcers Pulmonary:   [] Uses home oxygen   [] Productive cough   [] Hemoptysis   [] Wheeze  [] COPD   [] Asthma Neurologic:  [] Dizziness  [] Blackouts   [] Seizures   [] History of stroke   [] History of TIA  [] Aphasia   [] Temporary blindness   [] Dysphagia   [] Weakness or numbness in arms   [] Weakness or numbness in legs Musculoskeletal:  [] Arthritis   [] Joint swelling   [] Joint pain   [] Low back pain Hematologic:  [] Easy bruising  [] Easy bleeding   [] Hypercoagulable state   [] Anemic  [] Hepatitis Gastrointestinal:  [] Blood in stool   [] Vomiting blood  [] Gastroesophageal reflux/heartburn   [] Difficulty swallowing. Genitourinary:  [] Chronic kidney disease   [] Difficult urination  [] Frequent urination  [] Burning with urination   [] Blood in urine Skin:  [] Rashes   [] Ulcers   [] Wounds Psychological:  [] History of anxiety   []  History of major depression.  Positive for abdominal pain  Physical Examination  Vitals:   07/10/2020 2100 07/29/2020 2130 07/18/2020 2300 07/05/2020 2315  BP: 112/90 134/71 111/81 (!) 116/57  Pulse: (!) 106 (!) 110 (!) 121 (!) 124  Resp:   (!) 35   Temp:      TempSrc:      SpO2: 100% 99% 98% 99%  Weight:      Height:       Body mass index is 32.77 kg/m. Gen:  WD/WN, NAD Head: [place]/AT, No temporalis wasting. Prominent temp pulse not noted. Ear/Nose/Throat: Hearing grossly intact, nares w/o erythema or drainage, oropharynx w/o Erythema/Exudate Eyes: Sclera non-icteric, conjunctiva clear Neck: Trachea midline.  No JVD.  Pulmonary:  Good air movement, respirations not labored, equal bilaterally.  Cardiac: Tachycardic Vascular:  Vessel Right Left  Radial Palpable Palpable  Ulnar Palpable Palpable  Brachial Palpable Palpable  Carotid Palpable, without bruit Palpable, without bruit  Aorta Not palpable N/Castro  Femoral  Palpable Palpable  Popliteal Palpable Palpable  PT Palpable Palpable  DP Palpable Palpable   Gastrointestinal: Soft, mildly tender to palpation  Musculoskeletal: M/S 5/5 throughout.  Extremities without ischemic changes.  No deformity or atrophy. No edema. Neurologic: Sensation grossly intact in extremities.  Symmetrical.  Speech is fluent. Motor exam as listed above. Psychiatric: Judgment intact, Mood & affect appropriate for pt's clinical situation. Dermatologic: No rashes or ulcers noted.  No cellulitis or open wounds. Lymph : No Cervical, Axillary, or Inguinal lymphadenopathy.  CBC Lab Results  Component Value Date   WBC 10.2 07/08/2020   HGB 9.7 (L) 07/03/2020   HCT 31.4 (L) 08/02/2020   MCV 90.8 07/18/2020   PLT 215 07/08/2020   BMET    Component Value Date/Time  NA 140 07/29/2020 2024   K 4.1 07/03/2020 2024   CL 104 08/01/2020 2024   CO2 18 (L) 07/07/2020 2024   GLUCOSE 184 (H) 07/19/2020 2024   BUN 30 (H) 07/19/2020 2024   CREATININE 1.83 (H) 07/17/2020 2024   CALCIUM 8.9 07/10/2020 2024   GFRNONAA 28 (L) 07/06/2020 2024   Estimated Creatinine Clearance: 30.2 mL/min (Castro) (by C-G formula based on SCr of 1.83 mg/dL (H)).  COAG No results found for: INR, PROTIME  Radiology CT ABDOMEN PELVIS WO CONTRAST  Result Date: 07/31/2020 CLINICAL DATA:  Nausea, vomiting EXAM: CT ABDOMEN AND PELVIS WITHOUT CONTRAST TECHNIQUE: Multidetector CT imaging of the abdomen and pelvis was performed following the standard protocol without IV contrast. COMPARISON:  None. FINDINGS: Lower chest: There is asymmetric ground-glass pulmonary infiltrate within the right lung base, nonspecific, possibly infectious or inflammatory in nature. Superimposed minimal subpleural pulmonary fibrosis. The visualized heart and pericardium are unremarkable. Hepatobiliary: The liver is unremarkable. No intra or extrahepatic biliary ductal dilation. Layering high density material within the gallbladder lumen  likely represent small gallstones or hyperdense sludge. Pancreas: Unremarkable Spleen: Unremarkable Adrenals/Urinary Tract: There is extensive right perinephric stranding likely representing infiltration related to the retroperitoneal hemorrhage described more fully below. This obscures the right adrenal gland. Left adrenal gland is unremarkable. Kidneys are otherwise unremarkable. Bladder is unremarkable. Stomach/Bowel: The stomach, small bowel, and large bowel are unremarkable. The appendix is not clearly identified and is likely absent. Small free fluid is seen within the pelvis, nonspecific. No free intraperitoneal gas. Vascular/Lymphatic: Castro fusiform infrarenal abdominal aortic aneurysm is present measuring 7.3 x 7.5 x 9.5 cm in greatest dimension roughly 55 degree posterior angulation at the proximal neck and roughly 85 degree angulation between the aneurysm sac and the right common iliac artery. There is Castro large amount of hyperdense material seen anteriorly and right lateral to the aneurysm sac in keeping with acute hemorrhage within the retroperitoneum secondary to aneurysm rupture. Infiltrating hemorrhagic material extends into the right anterior and posterior pararenal space and subsequently into the right hemipelvis. 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 Extensive superimposed atherosclerotic calcification within the lower extremity arterial inflow. No pathologic adenopathy within the abdomen and pelvis. Reproductive: Uterus and bilateral adnexa are unremarkable. Other: Rectum unremarkable. Musculoskeletal: No acute bone abnormality. IMPRESSION: Ruptured 7.5 cm infrarenal abdominal aortic aneurysm with extensive retroperitoneal hemorrhage. Significant angulation at the juncture of the aneurysm and right common iliac artery. Moderate angulation involving the proximal neck and body of the aneurysm. These results were called by telephone at the time of interpretation on  07/04/2020 at 10:28 pm to provider [facility] , who verbally acknowledged these results. Electronically Signed   By: Fidela Salisbury MD   On: 07/31/2020 22:30   Assessment/Plan Halimah Mozel Burdett is Castro 69 year old female with Castro stated past medical history of prediabetes and hyperlipidemia who presents for worsening abdominal pain found to have ruptured abdominal aortic aneurysm on CT.  1.  Ruptured abdominal aortic aneurysm:  Ruptured 7.5cm infrarenal abdominal aortic aneurysm with extensive retroperitoneal hemorrhage.  Patient will be taken emergently to the angiography suite to undergo an endovascular abdominal aortic aneurysm repair possible open.  Extensive conversation had with the patient and her daughter in regard to the procedure, risks and benefits. Long conversation in regard to the grave matter the situation and expected mortality due to the nature of the procedure.  We will hopefully able to repair endovascularly however the patient is aware  that may have to convert to open.  Patient was transferred to the ICU after.  Patient and daughter expressed her understanding and are consenting to proceed.  Patient was taken emergently to the angiography suite. Seen and examined with Dr. Mayme Genta, PA-C  07/23/2020 11:28 PM    This Castro was created with Dragon medical transcription system.  Any error is purely unintentional

## 2020-07-09 NOTE — ED Notes (Addendum)
Pt vomiting x1. Brader MD informed. Verbal for zofran. MD aware of critical lactic

## 2020-07-09 NOTE — ED Notes (Addendum)
Pt growing restless. ED Provider at bedside. 2 RN's at bedside looking for additional IV access

## 2020-07-10 ENCOUNTER — Inpatient Hospital Stay: Payer: Medicare Other

## 2020-07-10 ENCOUNTER — Inpatient Hospital Stay (HOSPITAL_COMMUNITY)
Admit: 2020-07-10 | Discharge: 2020-07-10 | Disposition: A | Discharge status: Home / Self Care | Payer: Medicare Other | Attending: Pulmonary Disease | Admitting: Pulmonary Disease

## 2020-07-10 ENCOUNTER — Encounter: Admission: EM | Disposition: E | Payer: Self-pay | Source: Home / Self Care | Attending: Vascular Surgery

## 2020-07-10 ENCOUNTER — Encounter: Payer: Self-pay | Admitting: Vascular Surgery

## 2020-07-10 DIAGNOSIS — Z66 Do not resuscitate: Secondary | ICD-10-CM | POA: Diagnosis not present

## 2020-07-10 DIAGNOSIS — A419 Sepsis, unspecified organism: Secondary | ICD-10-CM | POA: Diagnosis not present

## 2020-07-10 DIAGNOSIS — E870 Hyperosmolality and hypernatremia: Secondary | ICD-10-CM | POA: Diagnosis not present

## 2020-07-10 DIAGNOSIS — J9600 Acute respiratory failure, unspecified whether with hypoxia or hypercapnia: Secondary | ICD-10-CM

## 2020-07-10 DIAGNOSIS — D62 Acute posthemorrhagic anemia: Secondary | ICD-10-CM | POA: Diagnosis not present

## 2020-07-10 DIAGNOSIS — G4733 Obstructive sleep apnea (adult) (pediatric): Secondary | ICD-10-CM | POA: Diagnosis present

## 2020-07-10 DIAGNOSIS — E1165 Type 2 diabetes mellitus with hyperglycemia: Secondary | ICD-10-CM | POA: Diagnosis not present

## 2020-07-10 DIAGNOSIS — R6521 Severe sepsis with septic shock: Secondary | ICD-10-CM

## 2020-07-10 DIAGNOSIS — G9340 Encephalopathy, unspecified: Secondary | ICD-10-CM

## 2020-07-10 DIAGNOSIS — R34 Anuria and oliguria: Secondary | ICD-10-CM | POA: Diagnosis not present

## 2020-07-10 DIAGNOSIS — N170 Acute kidney failure with tubular necrosis: Secondary | ICD-10-CM | POA: Diagnosis not present

## 2020-07-10 DIAGNOSIS — R578 Other shock: Secondary | ICD-10-CM | POA: Diagnosis not present

## 2020-07-10 DIAGNOSIS — I714 Abdominal aortic aneurysm, without rupture, unspecified: Secondary | ICD-10-CM | POA: Diagnosis present

## 2020-07-10 DIAGNOSIS — E875 Hyperkalemia: Secondary | ICD-10-CM

## 2020-07-10 DIAGNOSIS — I469 Cardiac arrest, cause unspecified: Secondary | ICD-10-CM | POA: Diagnosis not present

## 2020-07-10 DIAGNOSIS — J9601 Acute respiratory failure with hypoxia: Secondary | ICD-10-CM

## 2020-07-10 DIAGNOSIS — E162 Hypoglycemia, unspecified: Secondary | ICD-10-CM

## 2020-07-10 DIAGNOSIS — K559 Vascular disorder of intestine, unspecified: Secondary | ICD-10-CM | POA: Diagnosis not present

## 2020-07-10 DIAGNOSIS — J9602 Acute respiratory failure with hypercapnia: Secondary | ICD-10-CM | POA: Diagnosis not present

## 2020-07-10 DIAGNOSIS — I10 Essential (primary) hypertension: Secondary | ICD-10-CM | POA: Diagnosis present

## 2020-07-10 DIAGNOSIS — Z20822 Contact with and (suspected) exposure to covid-19: Secondary | ICD-10-CM | POA: Diagnosis present

## 2020-07-10 DIAGNOSIS — Z7189 Other specified counseling: Secondary | ICD-10-CM

## 2020-07-10 DIAGNOSIS — I713 Abdominal aortic aneurysm, ruptured: Secondary | ICD-10-CM | POA: Diagnosis present

## 2020-07-10 DIAGNOSIS — K7200 Acute and subacute hepatic failure without coma: Secondary | ICD-10-CM | POA: Diagnosis not present

## 2020-07-10 DIAGNOSIS — Q8789 Other specified congenital malformation syndromes, not elsewhere classified: Secondary | ICD-10-CM

## 2020-07-10 DIAGNOSIS — E872 Acidosis, unspecified: Secondary | ICD-10-CM

## 2020-07-10 DIAGNOSIS — K661 Hemoperitoneum: Secondary | ICD-10-CM | POA: Diagnosis present

## 2020-07-10 DIAGNOSIS — G928 Other toxic encephalopathy: Secondary | ICD-10-CM | POA: Diagnosis not present

## 2020-07-10 DIAGNOSIS — E11649 Type 2 diabetes mellitus with hypoglycemia without coma: Secondary | ICD-10-CM | POA: Diagnosis not present

## 2020-07-10 DIAGNOSIS — N179 Acute kidney failure, unspecified: Secondary | ICD-10-CM

## 2020-07-10 DIAGNOSIS — R58 Hemorrhage, not elsewhere classified: Secondary | ICD-10-CM

## 2020-07-10 DIAGNOSIS — E785 Hyperlipidemia, unspecified: Secondary | ICD-10-CM | POA: Diagnosis present

## 2020-07-10 HISTORY — PX: ENDOVASCULAR REPAIR/STENT GRAFT: CATH118280

## 2020-07-10 LAB — CBC WITH DIFFERENTIAL/PLATELET
Abs Immature Granulocytes: 2.26 10*3/uL — ABNORMAL HIGH (ref 0.00–0.07)
Abs Immature Granulocytes: 1.64 10*3/uL — ABNORMAL HIGH (ref 0.00–0.07)
Basophils Absolute: 0.1 10*3/uL (ref 0.0–0.1)
Basophils Absolute: 0.1 10*3/uL (ref 0.0–0.1)
Basophils Relative: 0 %
Basophils Relative: 0 %
Eosinophils Absolute: 0.1 10*3/uL (ref 0.0–0.5)
Eosinophils Absolute: 0 10*3/uL (ref 0.0–0.5)
Eosinophils Relative: 1 %
Eosinophils Relative: 0 %
HCT: 22.6 % — ABNORMAL LOW (ref 36.0–46.0)
HCT: 25.6 % — ABNORMAL LOW (ref 36.0–46.0)
Hemoglobin: 7.1 g/dL — ABNORMAL LOW (ref 12.0–15.0)
Hemoglobin: 8.5 g/dL — ABNORMAL LOW (ref 12.0–15.0)
Immature Granulocytes: 9 %
Immature Granulocytes: 7 %
Lymphocytes Relative: 8 %
Lymphocytes Relative: 6 %
Lymphs Abs: 2 10*3/uL (ref 0.7–4.0)
Lymphs Abs: 1.6 10*3/uL (ref 0.7–4.0)
MCH: 28.7 pg (ref 26.0–34.0)
MCH: 30.1 pg (ref 26.0–34.0)
MCHC: 31.4 g/dL (ref 30.0–36.0)
MCHC: 33.2 g/dL (ref 30.0–36.0)
MCV: 91.5 fL (ref 80.0–100.0)
MCV: 90.8 fL (ref 80.0–100.0)
Monocytes Absolute: 0.6 10*3/uL (ref 0.1–1.0)
Monocytes Absolute: 1.1 10*3/uL — ABNORMAL HIGH (ref 0.1–1.0)
Monocytes Relative: 2 %
Monocytes Relative: 4 %
Neutro Abs: 20.2 10*3/uL — ABNORMAL HIGH (ref 1.7–7.7)
Neutro Abs: 20.5 10*3/uL — ABNORMAL HIGH (ref 1.7–7.7)
Neutrophils Relative %: 80 %
Neutrophils Relative %: 83 %
Platelets: 96 10*3/uL — ABNORMAL LOW (ref 150–400)
Platelets: 86 10*3/uL — ABNORMAL LOW (ref 150–400)
RBC: 2.47 MIL/uL — ABNORMAL LOW (ref 3.87–5.11)
RBC: 2.82 MIL/uL — ABNORMAL LOW (ref 3.87–5.11)
RDW: 14.1 % (ref 11.5–15.5)
RDW: 15 % (ref 11.5–15.5)
Smear Review: DECREASED
Smear Review: DECREASED
WBC: 25.2 10*3/uL — ABNORMAL HIGH (ref 4.0–10.5)
WBC: 24.9 10*3/uL — ABNORMAL HIGH (ref 4.0–10.5)
nRBC: 0.2 % (ref 0.0–0.2)
nRBC: 0.2 % (ref 0.0–0.2)

## 2020-07-10 LAB — ECHOCARDIOGRAM COMPLETE
AR max vel: 2.68 cm2
AV Area VTI: 1.94 cm2
AV Area mean vel: 2.59 cm2
AV Mean grad: 5 mmHg
AV Peak grad: 11 mmHg
Ao pk vel: 1.66 m/s
Area-P 1/2: 8.43 cm2
Height: 63 in
S' Lateral: 2.94 cm
Weight: 2960 oz

## 2020-07-10 LAB — BLOOD GAS, ARTERIAL
Acid-base deficit: 15.7 mmol/L — ABNORMAL HIGH (ref 0.0–2.0)
Acid-base deficit: 3.9 mmol/L — ABNORMAL HIGH (ref 0.0–2.0)
Bicarbonate: 14.8 mmol/L — ABNORMAL LOW (ref 20.0–28.0)
Bicarbonate: 25 mmol/L (ref 20.0–28.0)
FIO2: 0.6
FIO2: 0.4
MECHVT: 400 mL
MECHVT: 400 mL
O2 Saturation: 99.2 %
O2 Saturation: 96.4 %
PEEP: 5 cmH2O
PEEP: 5 cmH2O
Patient temperature: 37
Patient temperature: 37
RATE: 20 resp/min
RATE: 400 resp/min
pCO2 arterial: 60 mmHg — ABNORMAL HIGH (ref 32.0–48.0)
pCO2 arterial: 70 mmHg (ref 32.0–48.0)
pH, Arterial: 7 — CL (ref 7.350–7.450)
pH, Arterial: 7.16 — CL (ref 7.350–7.450)
pO2, Arterial: 197 mmHg — ABNORMAL HIGH (ref 83.0–108.0)
pO2, Arterial: 106 mmHg (ref 83.0–108.0)

## 2020-07-10 LAB — RESP PANEL BY RT PCR (RSV, FLU A&B, COVID)
Influenza A by PCR: NEGATIVE
Influenza B by PCR: NEGATIVE
Respiratory Syncytial Virus by PCR: NEGATIVE
SARS Coronavirus 2 by RT PCR: NEGATIVE

## 2020-07-10 LAB — BASIC METABOLIC PANEL
Anion gap: 17 — ABNORMAL HIGH (ref 5–15)
BUN: 29 mg/dL — ABNORMAL HIGH (ref 8–23)
CO2: 24 mmol/L (ref 22–32)
Calcium: 6.1 mg/dL — CL (ref 8.9–10.3)
Chloride: 106 mmol/L (ref 98–111)
Creatinine, Ser: 2.48 mg/dL — ABNORMAL HIGH (ref 0.44–1.00)
GFR, Estimated: 19 mL/min — ABNORMAL LOW (ref >60)
Glucose, Bld: 60 mg/dL — ABNORMAL LOW (ref 70–99)
Potassium: 4.2 mmol/L (ref 3.5–5.1)
Sodium: 147 mmol/L — ABNORMAL HIGH (ref 135–145)

## 2020-07-10 LAB — COMPREHENSIVE METABOLIC PANEL
ALT: 109 U/L — ABNORMAL HIGH (ref 0–44)
AST: 121 U/L — ABNORMAL HIGH (ref 15–41)
Albumin: 2 g/dL — ABNORMAL LOW (ref 3.5–5.0)
Alkaline Phosphatase: 36 U/L — ABNORMAL LOW (ref 38–126)
Anion gap: 13 (ref 5–15)
BUN: 33 mg/dL — ABNORMAL HIGH (ref 8–23)
CO2: 23 mmol/L (ref 22–32)
Calcium: 6.2 mg/dL — CL (ref 8.9–10.3)
Chloride: 107 mmol/L (ref 98–111)
Creatinine, Ser: 2.28 mg/dL — ABNORMAL HIGH (ref 0.44–1.00)
GFR calc non Af Amer: 21 mL/min — ABNORMAL LOW (ref >60)
Glucose, Bld: 180 mg/dL — ABNORMAL HIGH (ref 70–99)
Potassium: 6.5 mmol/L (ref 3.5–5.1)
Sodium: 143 mmol/L (ref 135–145)
Total Bilirubin: 1.1 mg/dL (ref 0.3–1.2)
Total Protein: 3.6 g/dL — ABNORMAL LOW (ref 6.5–8.1)

## 2020-07-10 LAB — GLUCOSE, CAPILLARY
Glucose-Capillary: 100 mg/dL — ABNORMAL HIGH (ref 70–99)
Glucose-Capillary: 150 mg/dL — ABNORMAL HIGH (ref 70–99)
Glucose-Capillary: 154 mg/dL — ABNORMAL HIGH (ref 70–99)
Glucose-Capillary: 193 mg/dL — ABNORMAL HIGH (ref 70–99)
Glucose-Capillary: 202 mg/dL — ABNORMAL HIGH (ref 70–99)
Glucose-Capillary: 46 mg/dL — ABNORMAL LOW (ref 70–99)
Glucose-Capillary: 48 mg/dL — ABNORMAL LOW (ref 70–99)
Glucose-Capillary: 72 mg/dL (ref 70–99)
Glucose-Capillary: <10 mg/dL — CL (ref 70–99)

## 2020-07-10 LAB — RENAL FUNCTION PANEL
Albumin: 1.9 g/dL — ABNORMAL LOW (ref 3.5–5.0)
Anion gap: 17 — ABNORMAL HIGH (ref 5–15)
BUN: 27 mg/dL — ABNORMAL HIGH (ref 8–23)
CO2: 24 mmol/L (ref 22–32)
Calcium: 6.1 mg/dL — CL (ref 8.9–10.3)
Chloride: 105 mmol/L (ref 98–111)
Creatinine, Ser: 2.34 mg/dL — ABNORMAL HIGH (ref 0.44–1.00)
GFR, Estimated: 21 mL/min — ABNORMAL LOW (ref >60)
Glucose, Bld: 58 mg/dL — ABNORMAL LOW (ref 70–99)
Phosphorus: 8.2 mg/dL — ABNORMAL HIGH (ref 2.5–4.6)
Potassium: 4.2 mmol/L (ref 3.5–5.1)
Sodium: 146 mmol/L — ABNORMAL HIGH (ref 135–145)

## 2020-07-10 LAB — PROTIME-INR
INR: 2.1 — ABNORMAL HIGH (ref 0.8–1.2)
Prothrombin Time: 23.1 seconds — ABNORMAL HIGH (ref 11.4–15.2)

## 2020-07-10 LAB — PREPARE RBC (CROSSMATCH)

## 2020-07-10 LAB — LACTIC ACID, PLASMA
Lactic Acid, Venous: 10.3 mmol/L (ref 0.5–1.9)
Lactic Acid, Venous: 6.5 mmol/L (ref 0.5–1.9)
Lactic Acid, Venous: 8.7 mmol/L (ref 0.5–1.9)

## 2020-07-10 LAB — PROCALCITONIN: Procalcitonin: <0.1 ng/mL

## 2020-07-10 LAB — MAGNESIUM: Magnesium: 1.9 mg/dL (ref 1.7–2.4)

## 2020-07-10 LAB — MRSA PCR SCREENING: MRSA by PCR: NEGATIVE

## 2020-07-10 LAB — HEMOGLOBIN AND HEMATOCRIT, BLOOD
HCT: 21.6 % — ABNORMAL LOW (ref 36.0–46.0)
HCT: 16.2 % — ABNORMAL LOW (ref 36.0–46.0)
Hemoglobin: 7.4 g/dL — ABNORMAL LOW (ref 12.0–15.0)
Hemoglobin: 5 g/dL — ABNORMAL LOW (ref 12.0–15.0)

## 2020-07-10 LAB — HIV ANTIBODY (ROUTINE TESTING W REFLEX): HIV Screen 4th Generation wRfx: NONREACTIVE

## 2020-07-10 LAB — TROPONIN I (HIGH SENSITIVITY)
Troponin I (High Sensitivity): 66 ng/L — ABNORMAL HIGH (ref <18)
Troponin I (High Sensitivity): 99 ng/L — ABNORMAL HIGH (ref <18)

## 2020-07-10 LAB — HEMOGLOBIN A1C
Hgb A1c MFr Bld: 6.4 % — ABNORMAL HIGH (ref 4.8–5.6)
Mean Plasma Glucose: 136.98 mg/dL

## 2020-07-10 SURGERY — ENDOVASCULAR REPAIR/STENT GRAFT
Anesthesia: General

## 2020-07-10 MED ORDER — MIDAZOLAM HCL 2 MG/2ML IJ SOLN
INTRAMUSCULAR | Status: AC
  Filled 2020-07-10: qty 2

## 2020-07-10 MED ORDER — DEXTROSE 50 % IV SOLN
1.0000 | Freq: Once | INTRAVENOUS | Status: AC
  Administered 2020-07-10: 50 mL via INTRAVENOUS

## 2020-07-10 MED ORDER — STERILE WATER FOR INJECTION IV SOLN
INTRAVENOUS | Status: DC
  Filled 2020-07-10: qty 150
  Filled 2020-07-10 (×6): qty 850
  Filled 2020-07-10 (×2): qty 150
  Filled 2020-07-10 (×2): qty 850

## 2020-07-10 MED ORDER — VASOPRESSIN 20 UNIT/ML IV SOLN
INTRAVENOUS | Status: DC | PRN
  Administered 2020-07-10 (×4): 2.5 [IU] via INTRAVENOUS
  Administered 2020-07-10 (×3): 2 [IU] via INTRAVENOUS
  Administered 2020-07-10: 3 [IU] via INTRAVENOUS

## 2020-07-10 MED ORDER — MIDAZOLAM HCL 2 MG/2ML IJ SOLN
INTRAMUSCULAR | Status: DC | PRN
  Administered 2020-07-10 (×2): 2 mg via INTRAVENOUS

## 2020-07-10 MED ORDER — FAMOTIDINE IN NACL 20-0.9 MG/50ML-% IV SOLN
20.0000 mg | Freq: Two times a day (BID) | INTRAVENOUS | Status: DC
  Administered 2020-07-10: 20 mg via INTRAVENOUS
  Filled 2020-07-10: qty 50

## 2020-07-10 MED ORDER — ETOMIDATE 2 MG/ML IV SOLN
INTRAVENOUS | Status: DC | PRN
  Administered 2020-07-09: 18 mg via INTRAVENOUS

## 2020-07-10 MED ORDER — SODIUM BICARBONATE 8.4 % IV SOLN
50.0000 meq | Freq: Once | INTRAVENOUS | Status: AC
  Administered 2020-07-10: 50 meq via INTRAVENOUS

## 2020-07-10 MED ORDER — POLYETHYLENE GLYCOL 3350 17 G PO PACK: 17.0000 g | PACK | Freq: Every day | ORAL | Status: DC

## 2020-07-10 MED ORDER — INSULIN ASPART 100 UNIT/ML ~~LOC~~ SOLN
0.0000 [IU] | SUBCUTANEOUS | Status: DC
  Administered 2020-07-10: 5 [IU] via SUBCUTANEOUS
  Administered 2020-07-10 – 2020-07-11 (×2): 3 [IU] via SUBCUTANEOUS
  Administered 2020-07-11: 2 [IU] via SUBCUTANEOUS
  Filled 2020-07-10 (×5): qty 1

## 2020-07-10 MED ORDER — FENTANYL CITRATE (PF) 100 MCG/2ML IJ SOLN
INTRAMUSCULAR | Status: DC | PRN
Start: 2020-07-09 — End: 2020-07-10
  Administered 2020-07-09 – 2020-07-10 (×2): 50 ug via INTRAVENOUS

## 2020-07-10 MED ORDER — CHLORHEXIDINE GLUCONATE 0.12% ORAL RINSE (MEDLINE KIT)
15.0000 mL | Freq: Two times a day (BID) | OROMUCOSAL | Status: DC
  Administered 2020-07-10 – 2020-07-11 (×3): 15 mL via OROMUCOSAL

## 2020-07-10 MED ORDER — VECURONIUM BROMIDE 10 MG IV SOLR
10.0000 mg | Freq: Once | INTRAVENOUS | Status: AC
  Administered 2020-07-10: 10 mg via INTRAVENOUS

## 2020-07-10 MED ORDER — DOCUSATE SODIUM 50 MG/5ML PO LIQD: 100.0000 mg | Freq: Two times a day (BID) | ORAL | Status: DC

## 2020-07-10 MED ORDER — NOREPINEPHRINE 16 MG/250ML-% IV SOLN
0.0000 ug/min | INTRAVENOUS | Status: DC
  Administered 2020-07-10: 70 ug/min via INTRAVENOUS
  Administered 2020-07-10: 40 ug/min via INTRAVENOUS
  Administered 2020-07-10: 5 ug/min via INTRAVENOUS
  Administered 2020-07-10: 48 ug/min via INTRAVENOUS
  Administered 2020-07-11: 60 ug/min via INTRAVENOUS
  Administered 2020-07-11: 70 ug/min via INTRAVENOUS
  Administered 2020-07-11: 40 ug/min via INTRAVENOUS
  Filled 2020-07-10 (×7): qty 250

## 2020-07-10 MED ORDER — ONDANSETRON HCL 4 MG/2ML IJ SOLN
INTRAMUSCULAR | Status: DC | PRN
  Administered 2020-07-10: 4 mg via INTRAVENOUS

## 2020-07-10 MED ORDER — VANCOMYCIN HCL 1250 MG/250ML IV SOLN: 1250.0000 mg | INTRAVENOUS | Status: DC

## 2020-07-10 MED ORDER — PRISMASOL BGK 0/2.5 32-2.5 MEQ/L IV SOLN
INTRAVENOUS | Status: DC
  Filled 2020-07-10 (×6): qty 5000

## 2020-07-10 MED ORDER — IPRATROPIUM-ALBUTEROL 0.5-2.5 (3) MG/3ML IN SOLN: 3.0000 mL | RESPIRATORY_TRACT | Status: DC | PRN

## 2020-07-10 MED ORDER — SODIUM CHLORIDE 0.9 % IV SOLN
INTRAVENOUS | Status: DC
  Administered 2020-07-10: 100 mL/h via INTRAVENOUS

## 2020-07-10 MED ORDER — CEFAZOLIN SODIUM-DEXTROSE 1-4 GM/50ML-% IV SOLN: 1.0000 g | Freq: Three times a day (TID) | INTRAVENOUS | Status: DC

## 2020-07-10 MED ORDER — IOHEXOL 300 MG/ML  SOLN
INTRAMUSCULAR | Status: DC | PRN
  Administered 2020-07-10: 120 mL

## 2020-07-10 MED ORDER — PATIROMER SORBITEX CALCIUM 8.4 G PO PACK
16.8000 g | PACK | Freq: Once | ORAL | Status: AC
  Administered 2020-07-10: 16.8 g via ORAL
  Filled 2020-07-10: qty 2

## 2020-07-10 MED ORDER — PHENYLEPHRINE HCL-NACL 10-0.9 MG/250ML-% IV SOLN
25.0000 ug/min | INTRAVENOUS | Status: DC
  Administered 2020-07-10: 150 ug/min via INTRAVENOUS
  Administered 2020-07-10: 50 ug/min via INTRAVENOUS
  Filled 2020-07-10 (×2): qty 250

## 2020-07-10 MED ORDER — DEXMEDETOMIDINE HCL IN NACL 400 MCG/100ML IV SOLN
0.4000 ug/kg/h | INTRAVENOUS | Status: DC
  Administered 2020-07-10: 0.4 ug/kg/h via INTRAVENOUS

## 2020-07-10 MED ORDER — FENTANYL CITRATE (PF) 100 MCG/2ML IJ SOLN
INTRAMUSCULAR | Status: AC
  Administered 2020-07-10: 100 ug via INTRAVENOUS
  Filled 2020-07-10: qty 2

## 2020-07-10 MED ORDER — HEPARIN SODIUM (PORCINE) 1000 UNIT/ML IJ SOLN
INTRAMUSCULAR | Status: DC | PRN
  Administered 2020-07-10: 3000 [IU] via INTRAVENOUS

## 2020-07-10 MED ORDER — FENTANYL CITRATE (PF) 100 MCG/2ML IJ SOLN: 25.0000 ug | Freq: Once | INTRAMUSCULAR | Status: DC

## 2020-07-10 MED ORDER — SODIUM CHLORIDE 0.9% IV SOLUTION: Freq: Once | INTRAVENOUS | Status: AC

## 2020-07-10 MED ORDER — PRISMASOL BGK 0/2.5 32-2.5 MEQ/L IV SOLN
INTRAVENOUS | Status: DC
  Filled 2020-07-10 (×22): qty 5000

## 2020-07-10 MED ORDER — LACTATED RINGERS IV SOLN: INTRAVENOUS | Status: DC

## 2020-07-10 MED ORDER — DEXTROSE 50 % IV SOLN
25.0000 mL | Freq: Once | INTRAVENOUS | Status: AC
  Administered 2020-07-10: 25 mL via INTRAVENOUS
  Filled 2020-07-10: qty 50

## 2020-07-10 MED ORDER — SODIUM BICARBONATE 8.4 % IV SOLN: 100.0000 meq | Freq: Once | INTRAVENOUS | Status: AC

## 2020-07-10 MED ORDER — CHLORHEXIDINE GLUCONATE CLOTH 2 % EX PADS
6.0000 | MEDICATED_PAD | Freq: Every day | CUTANEOUS | Status: DC
  Administered 2020-07-10: 6 via TOPICAL

## 2020-07-10 MED ORDER — SODIUM BICARBONATE 8.4 % IV SOLN
50.0000 meq | Freq: Once | INTRAVENOUS | Status: AC
  Administered 2020-07-10: 50 meq via INTRAVENOUS
  Filled 2020-07-10: qty 50

## 2020-07-10 MED ORDER — SODIUM CHLORIDE 0.9 % IV SOLN
250.0000 mL | INTRAVENOUS | Status: DC
  Administered 2020-07-10: 250 mL via INTRAVENOUS

## 2020-07-10 MED ORDER — SODIUM CHLORIDE 0.9 % IV SOLN
INTRAVENOUS | Status: DC | PRN
  Administered 2020-07-10: 50 ug/min via INTRAVENOUS

## 2020-07-10 MED ORDER — VASOPRESSIN 20 UNITS/100 ML INFUSION FOR SHOCK
0.0000 [IU]/min | INTRAVENOUS | Status: DC
  Administered 2020-07-10 – 2020-07-11 (×4): 0.03 [IU]/min via INTRAVENOUS
  Filled 2020-07-10 (×4): qty 100

## 2020-07-10 MED ORDER — DEXAMETHASONE SODIUM PHOSPHATE 10 MG/ML IJ SOLN
INTRAMUSCULAR | Status: DC | PRN
  Administered 2020-07-10: 10 mg via INTRAVENOUS

## 2020-07-10 MED ORDER — FENTANYL BOLUS VIA INFUSION
25.0000 ug | INTRAVENOUS | Status: DC | PRN
  Filled 2020-07-10: qty 25

## 2020-07-10 MED ORDER — SODIUM BICARBONATE 8.4 % IV SOLN
INTRAVENOUS | Status: AC
  Administered 2020-07-10: 100 meq via INTRAVENOUS
  Filled 2020-07-10: qty 100

## 2020-07-10 MED ORDER — MIDAZOLAM HCL 2 MG/2ML IJ SOLN
INTRAMUSCULAR | Status: AC
  Administered 2020-07-10: 2 mg via INTRAVENOUS
  Filled 2020-07-10: qty 2

## 2020-07-10 MED ORDER — DEXTROSE 10 % IV SOLN: INTRAVENOUS | Status: DC

## 2020-07-10 MED ORDER — MIDAZOLAM HCL 2 MG/2ML IJ SOLN
1.0000 mg | INTRAMUSCULAR | Status: DC | PRN
  Administered 2020-07-10: 1 mg via INTRAVENOUS
  Filled 2020-07-10: qty 2

## 2020-07-10 MED ORDER — FENTANYL CITRATE (PF) 100 MCG/2ML IJ SOLN: 100.0000 ug | Freq: Once | INTRAMUSCULAR | Status: AC

## 2020-07-10 MED ORDER — PIPERACILLIN-TAZOBACTAM 3.375 G IVPB
3.3750 g | Freq: Three times a day (TID) | INTRAVENOUS | Status: DC
  Administered 2020-07-10: 3.375 g via INTRAVENOUS
  Filled 2020-07-10: qty 50

## 2020-07-10 MED ORDER — INSULIN ASPART 100 UNIT/ML ~~LOC~~ SOLN
10.0000 [IU] | Freq: Once | SUBCUTANEOUS | Status: AC
  Administered 2020-07-10: 10 [IU] via SUBCUTANEOUS
  Filled 2020-07-10: qty 1

## 2020-07-10 MED ORDER — LACTATED RINGERS IV BOLUS
500.0000 mL | Freq: Once | INTRAVENOUS | Status: AC
  Administered 2020-07-10: 500 mL via INTRAVENOUS

## 2020-07-10 MED ORDER — SODIUM CHLORIDE 0.9 % IV SOLN: INTRAVENOUS | Status: DC | PRN

## 2020-07-10 MED ORDER — FAMOTIDINE IN NACL 20-0.9 MG/50ML-% IV SOLN
20.0000 mg | INTRAVENOUS | Status: DC
  Administered 2020-07-10: 20 mg via INTRAVENOUS
  Filled 2020-07-10: qty 50

## 2020-07-10 MED ORDER — LACTATED RINGERS IV SOLN: INTRAVENOUS | Status: DC | PRN

## 2020-07-10 MED ORDER — AMIODARONE HCL IN DEXTROSE 360-4.14 MG/200ML-% IV SOLN
60.0000 mg/h | INTRAVENOUS | Status: AC
  Administered 2020-07-10: 60 mg/h via INTRAVENOUS
  Filled 2020-07-10: qty 200

## 2020-07-10 MED ORDER — VANCOMYCIN HCL 2000 MG/400ML IV SOLN
2000.0000 mg | Freq: Once | INTRAVENOUS | Status: AC
  Administered 2020-07-10: 2000 mg via INTRAVENOUS
  Filled 2020-07-10: qty 400

## 2020-07-10 MED ORDER — PIPERACILLIN-TAZOBACTAM 3.375 G IVPB 30 MIN
3.3750 g | Freq: Four times a day (QID) | INTRAVENOUS | Status: DC
  Administered 2020-07-10 – 2020-07-11 (×4): 3.375 g via INTRAVENOUS
  Filled 2020-07-10 (×8): qty 50

## 2020-07-10 MED ORDER — HEPARIN SODIUM (PORCINE) 1000 UNIT/ML DIALYSIS
1000.0000 [IU] | INTRAMUSCULAR | Status: DC | PRN
  Filled 2020-07-10: qty 6

## 2020-07-10 MED ORDER — MIDAZOLAM HCL 2 MG/2ML IJ SOLN: 2.0000 mg | Freq: Once | INTRAMUSCULAR | Status: AC

## 2020-07-10 MED ORDER — FENTANYL 2500MCG IN NS 250ML (10MCG/ML) PREMIX INFUSION
25.0000 ug/h | INTRAVENOUS | Status: DC
  Administered 2020-07-10 (×2): 100 ug/h via INTRAVENOUS
  Filled 2020-07-10 (×2): qty 250

## 2020-07-10 MED ORDER — PHENYLEPHRINE HCL (PRESSORS) 10 MG/ML IV SOLN
INTRAVENOUS | Status: DC | PRN
  Administered 2020-07-10: 200 ug via INTRAVENOUS
  Administered 2020-07-10: 100 ug via INTRAVENOUS
  Administered 2020-07-10 (×4): 200 ug via INTRAVENOUS

## 2020-07-10 MED ORDER — ROCURONIUM BROMIDE 100 MG/10ML IV SOLN
INTRAVENOUS | Status: DC | PRN
  Administered 2020-07-10: 20 mg via INTRAVENOUS
  Administered 2020-07-10: 40 mg via INTRAVENOUS

## 2020-07-10 MED ORDER — HYDROCORTISONE NA SUCCINATE PF 100 MG IJ SOLR
50.0000 mg | Freq: Four times a day (QID) | INTRAMUSCULAR | Status: DC
  Administered 2020-07-10 – 2020-07-11 (×6): 50 mg via INTRAVENOUS
  Filled 2020-07-10 (×6): qty 2

## 2020-07-10 MED ORDER — AMIODARONE HCL IN DEXTROSE 360-4.14 MG/200ML-% IV SOLN
30.0000 mg/h | INTRAVENOUS | Status: DC
  Administered 2020-07-11: 30 mg/h via INTRAVENOUS
  Filled 2020-07-10: qty 200

## 2020-07-10 MED ORDER — STERILE WATER FOR INJECTION IV SOLN: Freq: Once | INTRAVENOUS | Status: AC

## 2020-07-10 MED ORDER — SODIUM BICARBONATE 8.4 % IV SOLN
INTRAVENOUS | Status: DC | PRN
  Administered 2020-07-10: 50 meq via INTRAVENOUS

## 2020-07-10 MED ORDER — INSULIN REGULAR HUMAN 100 UNIT/ML IJ SOLN
10.0000 [IU] | Freq: Once | INTRAMUSCULAR | Status: DC
  Filled 2020-07-10: qty 3

## 2020-07-10 MED ORDER — SUCCINYLCHOLINE CHLORIDE 20 MG/ML IJ SOLN
INTRAMUSCULAR | Status: DC | PRN
  Administered 2020-07-09: 100 mg via INTRAVENOUS

## 2020-07-10 MED ORDER — ORAL CARE MOUTH RINSE
15.0000 mL | OROMUCOSAL | Status: DC
  Administered 2020-07-10 – 2020-07-11 (×16): 15 mL via OROMUCOSAL

## 2020-07-10 MED ORDER — PHENYLEPHRINE CONCENTRATED 100MG/250ML (0.4 MG/ML) INFUSION SIMPLE
0.0000 ug/min | INTRAVENOUS | Status: DC
  Administered 2020-07-10: 400 ug/min via INTRAVENOUS
  Administered 2020-07-10: 150 ug/min via INTRAVENOUS
  Administered 2020-07-10: 250 ug/min via INTRAVENOUS
  Administered 2020-07-10 – 2020-07-11 (×4): 400 ug/min via INTRAVENOUS
  Filled 2020-07-10 (×8): qty 250

## 2020-07-10 MED ORDER — MIDAZOLAM HCL 2 MG/2ML IJ SOLN: 1.0000 mg | INTRAMUSCULAR | Status: DC | PRN

## 2020-07-10 MED ORDER — HEPARIN SODIUM (PORCINE) 1000 UNIT/ML IJ SOLN
INTRAMUSCULAR | Status: AC
  Filled 2020-07-10: qty 1

## 2020-07-10 SURGICAL SUPPLY — 30 items
CATH ACCU-VU SIZ PIG 5F 70CM (CATHETERS) ×4 IMPLANT
CATH BALLN CODA 9X100X32 (BALLOONS) ×2 IMPLANT
CATH BEACON 5 .035 65 KMP TIP (CATHETERS) ×2 IMPLANT
CATH BEACON 5 .035 65 RIM TIP (CATHETERS) ×2 IMPLANT
DEVICE CLOSURE PERCLS PRGLD 6F (VASCULAR PRODUCTS) ×5 IMPLANT
DEVICE SAFEGUARD 24CM (GAUZE/BANDAGES/DRESSINGS) ×4 IMPLANT
DEVICE TORQUE .025-.038 (MISCELLANEOUS) ×2 IMPLANT
DRYSEAL FLEXSHEATH 12FR 33CM (SHEATH) ×1
DRYSEAL FLEXSHEATH 16FR 33CM (SHEATH) ×1
DRYSEAL FLEXSHEATH 18FR 33CM (SHEATH) ×1
EXCLDR EXT ENDO 28X4.5 16F (Endovascular Graft) ×2 IMPLANT
EXCLDR TRNK 28.5X14.5X12 16F (Endovascular Graft) ×2 IMPLANT
EXCLUDER EXT ENDO 28X4.5 16F (Endovascular Graft) ×1 IMPLANT
EXCLUDER TNK 28.5X14.5X12 16F (Endovascular Graft) ×1 IMPLANT
GLIDEWIRE ADV .035X180CM (WIRE) ×2 IMPLANT
GLIDEWIRE STIFF .35X180X3 HYDR (WIRE) ×2 IMPLANT
LEG CONTRALATERAL 16X14.5X14 (Vascular Products) ×4 IMPLANT
LEG CONTRALATERAL 27X14 (Vascular Products) ×2 IMPLANT
PACK ANGIOGRAPHY (CUSTOM PROCEDURE TRAY) ×2 IMPLANT
PERCLOSE PROGLIDE 6F (VASCULAR PRODUCTS) ×10
SHEATH BRITE TIP 6FRX11 (SHEATH) ×4 IMPLANT
SHEATH BRITE TIP 8FRX11 (SHEATH) ×4 IMPLANT
SHEATH DRYSEAL FLEX 12FR 33CM (SHEATH) ×1 IMPLANT
SHEATH DRYSEAL FLEX 16FR 33CM (SHEATH) ×1 IMPLANT
SHEATH DRYSEAL FLEX 18FR 33CM (SHEATH) ×1 IMPLANT
SPONGE XRAY 4X4 16PLY STRL (MISCELLANEOUS) ×6 IMPLANT
SYR MEDRAD MARK 7 150ML (SYRINGE) ×2 IMPLANT
TUBING CONTRAST HIGH PRESS 72 (TUBING) ×4 IMPLANT
WIRE G LUND 35X260X7 (WIRE) ×2 IMPLANT
WIRE J 3MM .035X145CM (WIRE) ×4 IMPLANT

## 2020-07-10 NOTE — Progress Notes (Signed)
Report received. Very unstable 69 year old female post endovascular repair of ruptured aortic aneurym. Presently on Vasopressin,Epinephripne, Levophed and Sodium Bicarb. IV drips

## 2020-07-10 NOTE — Progress Notes (Signed)
  The Risks and Benefits of the VASC CATH, HD and CRRT  procedure  were explained to patient/family.  I have discussed the risk for Acute Bleeding, increased chance of Infection, increased chance of Respiratory Failure and Cardiac Arrest, increased chance of pneumothorax and collapsed lung, as well as increased Stroke and Death.  The patient/family understand the risks and benefits and have agreed to proceed with all the  Procedures    Corrin Parker, M.D.  [facility]  Medical Director [place] Director [facility] Cardio-Pulmonary Department

## 2020-07-10 NOTE — Progress Notes (Signed)
PATIENT WITH RUPTURED AAA SEVERE SHOCK WITH ACIDOSIS-HIGH SUSPICION FOR ISCHEMIC GUT, NOW WITH MULTIORGAN FAILURE.  CLINICAL CONDITION AND FINDINGS RELAYED TO FAMILY

## 2020-07-10 NOTE — Progress Notes (Signed)
Initial Nutrition Assessment  DOCUMENTATION CODES:   Obesity unspecified  INTERVENTION:  Once patient is more stable and appropriate for initiation of tube feeds recommend: -Initiate Vital 1.5 Cal at 20 mL/hr and advance by 10 mL/hr every 8 hours to goal rate of 40 mL/hr (960 mL goal daily volume) -PROSource TF 90 mL TID per tube -Goal regimen provides 1680 kcal, 131 grams of protein, 730 mL H2O daily -MVI daily per tube  NUTRITION DIAGNOSIS:   Inadequate oral intake related to inability to eat as evidenced by NPO status.  GOAL:   Patient will meet greater than or equal to 90% of their needs  MONITOR:   Vent status, Labs, Weight trends, I & O's  REASON FOR ASSESSMENT:   Ventilator    ASSESSMENT:   69 year old female with PMHx of pre-diabetes, HLD who presented with ruptured AAA s/p endovascular aneurysm repair on 10/8 now admitted to ICU on ventilator with AKI, lactic acidosis, hemorrhagic shock, anemia requiring transfusions.   Patient is currently intubated on ventilator support MV: 9.4 L/min Temp (24hrs), Avg:97.3 F (36.3 C), Min:94.3 F (34.6 C), Max:99.7 F (37.6 C)  Medications reviewed and include: Colace 100 mg BID, Solu-Cortef 50 mg Q6hrs IV, Novolog 0-15 units Q4hrs, Miralax, famotidine, fentanyl gtt, norepinephrine gtt at 40 mcg/min, phenylephrine gtt at 200 mcg/min, Zosyn, sodium bicarbonate 150 mEq in sterile water at 125 mL/hr, vasopressin gtt at 0.03 units/min.  Labs reviewed: CBG 193, Potassium 6.5, BUN 33, Creatinine 2.28.  I/O: 425 mL UOP overnight  Only previous wt in chart is 77.1 kg from 10/08/2018. Patient is currently documented to be 83.9 kg (185 lbs). Will monitor weight trends.  Enteral Access: 16 Fr. OGT placed 10/8; terminates in gastric body per chest x-ray 10/8  Patient does not meet criteria for malnutrition at this time.  Discussed with RN and on rounds. No plans for tube feeds at this time. Plan is for initiation of CRRT today. RD  feels patient is not appropriate for hypocaloric nutrition so will use PSU 2010 to calculate needs at this time.  NUTRITION - FOCUSED PHYSICAL EXAM:    Most Recent Value  Orbital Region No depletion  Upper Arm Region No depletion  Thoracic and Lumbar Region No depletion  Buccal Region Unable to assess  Temple Region No depletion  Clavicle Bone Region No depletion  Clavicle and Acromion Bone Region No depletion  Scapular Bone Region Unable to assess  Dorsal Hand No depletion  Patellar Region No depletion  Anterior Thigh Region No depletion  Posterior Calf Region No depletion  Edema (RD Assessment) None  Hair Reviewed  Eyes Unable to assess  Mouth Unable to assess  Skin Reviewed  Nails Reviewed     Diet Order:   Diet Order            Diet NPO time specified  Diet effective now                EDUCATION NEEDS:   No education needs have been identified at this time  Skin:  Skin Assessment: Reviewed RN Assessment  Last BM:  Unknown  Height:   Ht Readings from Last 1 Encounters:  07/18/2020 5\' 3"  (1.6 m)   Weight:   Wt Readings from Last 1 Encounters:  07/23/2020 83.9 kg   Ideal Body Weight:  52.3 kg  BMI:  Body mass index is 32.77 kg/m.  Estimated Nutritional Needs:   Kcal:  1610  Protein:  125-135 grams  Fluid:  2  L/day  Jacklynn Barnacle, MS, RD, LDN Pager number available on Amion

## 2020-07-10 NOTE — Progress Notes (Signed)
Spoke with pts daughter Samayah Novinger, son, grandson, and sister Alberteen Spindle regarding current plan of care, pts poor prognosis, and code status.  After detailed discussion pts family decided to change pts code status to DNR, but continue current plan of care at this time.  Will continue to monitor and assess pt.  Marda Stalker, [place] Pager [phone] (please enter 7 digits) PCCM Consult Pager [phone] (please enter 7 digits)

## 2020-07-10 NOTE — Progress Notes (Signed)
Pharmacy Antibiotic Note  Isabel Castro is a 69 y.o. female admitted on 07/24/2020 with a ruptured AAA.  Pharmacy was consulted for Zosyn dosing. Since admission nephrology has initiated continuous renal placement therapy secondary to acute tubular necrosis. She remains ventilated and sedated  Plan:   Adjust Zosyn dose to Zosyn 3.375 gm IV every 6 hours  Height: 5\' 3"  (160 cm) Weight: 83.9 kg (185 lb) IBW/kg (Calculated) : 52.4  Temp (24hrs), Avg:95.7 F (35.4 C), Min:94.3 F (34.6 C), Max:98.6 F (37 C)  Recent Labs  Lab 07/03/2020 2024 07/04/2020 2201 07/24/2020 2300 07/03/2020 0450  WBC 10.2  --   --  25.2*  CREATININE 1.83*  --   --  2.00*  LATICACIDVEN  --  6.5* 9.1* 10.3*    Estimated Creatinine Clearance: 27.6 mL/min (A) (by C-G formula based on SCr of 2 mg/dL (H)).    No Known Allergies  Antimicrobials this admission: vancomycin 10/7 >> 10/8 Zosyn 10/7 >>   Microbiology results: 10/7 BCx: pending 10/7 UCx: pending  10/7 Sputum: pending  10/8 MRSA PCR: negative  Thank you for allowing pharmacy to be a part of this patient's care.  Dallie Piles 07/25/2020 7:22 AM

## 2020-07-10 NOTE — Op Note (Signed)
OPERATIVE NOTE   PROCEDURE: 1. US guidance for vascular access, bilateral femoral arteries 2. Catheter placement into aorta from bilateral femoral approaches 3. Placement of a 28 mm x 12 x cm length Gore Excluder Endoprosthesis main body left with a 14 mm diameter by 14 cm length right iliac bridge piece and then a 27 mm diameter by 14 cm length right iliac limb contralateral limb 4. Placement of a 14 mm diameter by 14 cm length left iliac extension limb 5. Placement of a 28 mm diameter proximal aortic cuff 6. ProGlide closure devices bilateral femoral arteries  PRE-OPERATIVE DIAGNOSIS: AAA, ruptured  POST-OPERATIVE DIAGNOSIS: same  SURGEON: Leotis Pain, MD   ANESTHESIA: General  ESTIMATED BLOOD LOSS: 25 cc  FINDING(S): 1.  AAA  SPECIMEN(S):  none  INDICATIONS:   Isabel Castro is a 69 y.o. female who presents with a ruptured abdominal aortic aneurysm with severe abdominal pain and hypotension requiring blood products. The anatomy was suitable for endovascular repair.  Risks and benefits of repair in an endovascular fashion were discussed and informed consent was obtained. Co-surgeons are used to expedite the procedure and reduce operative time as bilateral work needs to be done.  DESCRIPTION: After obtaining full informed written consent, the patient was brought back to the operating room and placed supine upon the operating table for an emergent repair of her ruptured abdominal aortic aneurysm.  The patient received IV antibiotics prior to induction.  After obtaining adequate anesthesia, the patient was prepped and draped in the standard fashion for endovascular AAA repair  We then began by gaining access to both femoral arteries with US guidance.  The femoral arteries were found to be patent and accessed without difficulty with a needle under ultrasound guidance without difficulty on each side and permanent images were recorded.  We then placed 2 proglide devices on each  side in a pre-close fashion and placed 8 French sheaths. The patient was then given 3000 units of intravenous heparin. The Pigtail catheter was placed into the aorta from the right side. Using this image, we selected a 28 mm diameter by 12 cm length Main body device.  Over a stiff wire, an 2 French sheath was placed up the left. The main body was then placed through the 18 French sheath up the left. A Kumpe catheter was placed up the right side and a magnified image at the renal arteries was performed. The main body was then deployed just below the lowest renal artery which was the left. The Kumpe catheter was used to cannulate the contralateral gate without difficulty and successful cannulation was confirmed by twirling the pigtail catheter in the main body. We then placed a stiff wire and a retrograde arteriogram was performed through the right femoral sheath. We upsized to the 16 [place] sheath for the contralateral limb and a 14 mm diameter by 14 cm length bridge piece and then a 27 mm diameter by 14 cm length right iliac limb was selected and deployed down to just above the right hypogastric artery. The main body deployment was then completed. Based off the angiographic findings, extension limbs were necessary.  A 14 mm diameter by 14 cm length left iliac extension limb was extended down to just above the left hypogastric artery. All junction points and seals zones were treated with the compliant balloon. The pigtail catheter was then replaced and a completion angiogram was performed.  A proximal type Ia endoleak was detected on completion angiography.  There was good  seal in the iliac arteries and this was confirmed on a retrograde image on the right which was the more difficult seal.  The renal arteries were found to be widely patent.  I then used a 28 mm diameter proximal aortic cuff and placed this up at the proximal seal zone.  A compliant balloon was then used and treated again proximally.  At this point,  no further endoleak was identified and there was brisk flow through the stent graft. At this point we elected to terminate the procedure. We secured the pro glide devices for hemostasis on the femoral arteries. The skin incision was closed with a 4-0 Monocryl. Dermabond and pressure dressing were placed. The patient was taken to the recovery room in stable condition having tolerated the procedure well.  COMPLICATIONS: none  CONDITION: stable  Leotis Pain  07/18/2020, 1:46 AM   This note was created with Dragon Medical transcription system. Any errors in dictation are purely unintentional.

## 2020-07-10 NOTE — Progress Notes (Signed)
0830 Patient awake and trying to pull on endotracheal tube. Mitts applied bilateral hands. Followed commands. MAE upon command. Daughter present. Explained to daughter several times seriousness of her condition and the probable need for continuous temporary

## 2020-07-10 NOTE — Consult Note (Signed)
[facility] CONSULT NOTE    Date: 07/26/2020                  Patient Name:  Isabel Castro  MRN: [id number]  DOB: 1951/04/02  Age / Sex: 69 y.o., female         PCP: Zeb Comfort, MD                 Service Requesting Consult:  Critical care                 Reason for Consult:  Acute kidney injury in the setting of ruptured AAA            History of Present Illness: Patient is a 69 y.o. female with a PMHx of hypertension, hyperlipidemia, prediabetes, who was admitted to [facility] on 07/25/2020 for evaluation of abdominal pain.  Initial rapid evaluation revealed hemorrhagic shock in the setting of ruptured abdominal aortic aneurysm with extensive retroperitoneal hemorrhage.  Patient had emergent endovascular repair by vascular surgery.  Currently unable to provide any history.  We are asked to see her for acute kidney injury.  Baseline creatinine appears to be 1.0 from August.  BUN currently 33 with a creatinine of 2.2.  However potassium quite high at 6.5.  Severe lactic acidosis noted.  Patient currently on sodium bicarbonate drip.  Urine output thus far this morning 275 cc.  We talked at length with the team and have recommended initiation of continuous renal placement therapy.  Critical care to place temporary dialysis catheter.   Medications: Outpatient medications: Medications Prior to Admission  Medication Sig Dispense Refill Last Dose  . atorvastatin (LIPITOR) 10 MG tablet Take 10 mg by mouth daily.   Unknown at Unknown  . Cyanocobalamin (VITAMIN B 12) 500 MCG TABS Take 500 mg by mouth daily.   Unknown at Unknown  . ELDERBERRY PO Take by mouth.   Unknown at Unknown  . Multiple Vitamin (MULTIVITAMIN) tablet Take 1 tablet by mouth daily.   Unknown at Unknown  . erythromycin ophthalmic ointment Place 1 application into the left eye at bedtime. (Patient not taking: Reported on 07/05/2020)   Not Taking at Unknown time  . fluticasone (FLONASE) 50 MCG/ACT  nasal spray Place 2 sprays into both nostrils 2 (two) times daily. (Patient not taking: Reported on 07/26/2020)   Not Taking at Unknown time  . meloxicam (MOBIC) 15 MG tablet Take 1 tablet (15 mg total) by mouth daily. (Patient not taking: Reported on 10/08/2018) 30 tablet 0 Not Taking at Unknown time  . nicotine (NICODERM CQ - DOSED IN MG/24 HOURS) 21 mg/24hr patch Place 21 mg onto the skin daily. (Patient not taking: Reported on 07/23/2020)   Not Taking at Unknown time  . olopatadine (PATANOL) 0.1 % ophthalmic solution Place 1 drop into both eyes 2 (two) times daily. (Patient not taking: Reported on 07/15/2020)   Not Taking at Unknown time  . tiZANidine (ZANAFLEX) 4 MG capsule Take 1 capsule (4 mg total) by mouth 3 (three) times daily. (Patient not taking: Reported on 10/08/2018) 30 capsule 0 Not Taking at Unknown time    Current medications: Current Facility-Administered Medications  Medication Dose Route Frequency Provider Last Rate Last Admin  . 0.9 %  sodium chloride infusion  10 mL/hr Intravenous Once Naaman Plummer, MD      . 0.9 %  sodium chloride infusion  250 mL Intravenous Continuous Bradly Bienenstock, NP 30 mL/hr at 07/03/2020 0518 Rate Verify  at 07/30/2020 0518  . 0.9 %  sodium chloride infusion   Intravenous Continuous Darel Hong D, NP 100 mL/hr at 07/22/2020 0546 New Bag at 07/22/2020 0546  . chlorhexidine gluconate (MEDLINE KIT) (PERIDEX) 0.12 % solution 15 mL  15 mL Mouth Rinse BID Bradly Bienenstock, NP      . Chlorhexidine Gluconate Cloth 2 % PADS 6 each  6 each Topical Daily Darel Hong D, NP      . docusate (COLACE) 50 MG/5ML liquid 100 mg  100 mg Oral BID Darel Hong D, NP      . famotidine (PEPCID) IVPB 20 mg premix  20 mg Intravenous Q24H Dallie Piles, [place]      . fentaNYL (SUBLIMAZE) bolus via infusion 25 mcg  25 mcg Intravenous Q15 min PRN Bradly Bienenstock, NP      . fentaNYL (SUBLIMAZE) injection 25 mcg  25 mcg Intravenous Once Darel Hong D, NP      . fentaNYL  2536mg in NS 2533m(1032mml) infusion-PREMIX  25-200 mcg/hr Intravenous Continuous KeeDarel Hong NP 10 mL/hr at 07/23/2020 0518 100 mcg/hr at 07/17/2020 0518  . hydrocortisone sodium succinate (SOLU-CORTEF) 100 MG injection 50 mg  50 mg Intravenous Q6H KeeDarel Hong NP   50 mg at 07/19/2020 0514  . insulin aspart (novoLOG) injection 0-15 Units  0-15 Units Subcutaneous Q4H KeeDarel Hong NP   3 Units at 07/29/2020 0830  . ipratropium-albuterol (DUONEB) 0.5-2.5 (3) MG/3ML nebulizer solution 3 mL  3 mL Nebulization Q4H PRN KeeDarel Hong NP      . MEDLINE mouth rinse  15 mL Mouth Rinse 10 times per day KeeDarel Hong NP   15 mL at 07/03/2020 0547  . midazolam (VERSED) injection 1 mg  1 mg Intravenous Q15 min PRN KeeDarel Hong NP   1 mg at 07/04/2020 0426  . midazolam (VERSED) injection 1 mg  1 mg Intravenous Q2H PRN KeeDarel Hong NP      . norepinephrine (LEVOPHED) 16 mg in 250m14memix infusion  0-40 mcg/min Intravenous Titrated KeenDarel HongNP 9.38 mL/hr at 07/22/2020 0518 10 mcg/min at 07/24/2020 0518  . phenylephrine CONCENTRATED 100mg59msodium chloride 0.9% 250mL 86mmg/mL79mnfusion  0-400 mcg/min Intravenous Titrated Keene, Darel Hong30 mL/hr at 07/13/2020 0518 200 mcg/min at 07/15/2020 0518  . piperacillin-tazobactam (ZOSYN) IVPB 3.375 g  3.375 g Intravenous Q8H Dew, JaAlgernon Huxley.5 mL/hr at 07/05/2020 0614 3.375 g at 07/12/2020 0614  . polyethylene glycol (MIRALAX / GLYCOLAX) packet 17 g  17 g Oral Daily Keene, Darel Hong     . sodium bicarbonate 150 mEq in sterile water 1,000 mL infusion   Intravenous Continuous Keene, Bradly Bienenstock5 mL/hr at 07/12/2020 0646 Rate Change at 07/23/2020 0646  . [START ON 07/12/2015-Oct-2021mycin (VANCOREADY) IVPB 1250 mg/250 mL  1,250 mg Intravenous Q24H Dew, JaAlgernon Huxley   . vasopressin (PITRESSIN) 20 Units in sodium chloride 0.9 % 100 mL infusion-*FOR SHOCK*  0-0.03 Units/min Intravenous Continuous Keene, Darel Hong9 mL/hr at  07/08/2020 0518 0.03 Units/min at 07/19/2020 0518      Allergies: No Known Allergies    Past Medical History: Past Medical History:  Diagnosis Date  . Hyperlipidemia   . Pre-diabetes      Past Surgical History: Past Surgical History:  Procedure Laterality Date  . COLONOSCOPY WITH PROPOFOL N/A 10/08/2018   Procedure: COLONOSCOPY WITH PROPOFOL;  Surgeon: ElliottManya Silvas  MD;  Location: [facility] ENDOSCOPY;  Service: Endoscopy;  Laterality: N/A;     Family History: Family History  Problem Relation Age of Onset  . Breast cancer Neg Hx      Social History: Social History   Socioeconomic History  . Marital status: Single    Spouse name: Not on file  . Number of children: Not on file  . Years of education: Not on file  . Highest education level: Not on file  Occupational History  . Not on file  Tobacco Use  . Smoking status: Former Smoker    Packs/day: 1.00    Years: 15.00    Pack years: 15.00    Types: Cigarettes  . Smokeless tobacco: Never Used  Vaping Use  . Vaping Use: Never used  Substance and Sexual Activity  . Alcohol use: Yes  . Drug use: Never  . Sexual activity: Not on file  Other Topics Concern  . Not on file  Social History Narrative  . Not on file   Social Determinants of Health   Financial Resource Strain:   . Difficulty of Paying Living Expenses: Not on file  Food Insecurity:   . Worried About Charity fundraiser in the Last Year: Not on file  . Ran Out of Food in the Last Year: Not on file  Transportation Needs:   . Lack of Transportation (Medical): Not on file  . Lack of Transportation (Non-Medical): Not on file  Physical Activity:   . Days of Exercise per Week: Not on file  . Minutes of Exercise per Session: Not on file  Stress:   . Feeling of Stress : Not on file  Social Connections:   . Frequency of Communication with Friends and Family: Not on file  . Frequency of Social Gatherings with Friends and Family: Not on file  . Attends  Religious Services: Not on file  . Active Member of Clubs or Organizations: Not on file  . Attends Archivist Meetings: Not on file  . Marital Status: Not on file  Intimate Partner Violence:   . Fear of Current or Ex-Partner: Not on file  . Emotionally Abused: Not on file  . Physically Abused: Not on file  . Sexually Abused: Not on file     Review of Systems: Patient unable to provide review of systems as she is currently intubated  Vital Signs: Blood pressure (!) 153/78, pulse (!) 125, temperature 97.9 F (36.6 C), resp. rate (!) 25, height 5' 3"  (1.6 m), weight 83.9 kg, SpO2 100 %.  Weight trends: Filed Weights   07/08/2020 1936  Weight: 83.9 kg    Physical Exam: Physical Exam: General:  Critically ill-appearing  Head:  Endotracheal tube in place  Eyes:  Anicteric  Neck:  Supple  Lungs:   Coarse breath sounds bilateral, vent assisted  Heart:  S1S2 tachycardic  Abdomen:   Significant distention, no bowel sounds auscultated  Extremities:  2+ lower extremity edema  Neurologic:  Intubated, sedated  Skin:  No acute rash  Access:  No dialysis access present at the moment    Lab results: Basic Metabolic Panel: Recent Labs  Lab 07/27/2020 2024 07/16/2020 0450 07/24/2020 0831  NA 140 142 143  K 4.1 5.7* 6.5*  CL 104 110 107  CO2 18* 18* 23  GLUCOSE 184* 246* 180*  BUN 30* 31* 33*  CREATININE 1.83* 2.00* 2.28*  CALCIUM 8.9 6.4* 6.2*  MG  --  1.9  --     Liver  Function Tests: Recent Labs  Lab 07/13/2020 2024 07/08/2020 0450 07/25/2020 0831  AST 28 34 121*  ALT 19 20 109*  ALKPHOS 46 28* 36*  BILITOT 0.8 0.7 1.1  PROT 7.0 3.2* 3.6*  ALBUMIN 3.8 1.8* 2.0*   Recent Labs  Lab 07/08/2020 2024  LIPASE 40   No results for input(s): AMMONIA in the last 168 hours.  CBC: Recent Labs  Lab 07/24/2020 2024 07/04/2020 0450 07/17/2020 0826  WBC 10.2 25.2* 24.9*  NEUTROABS  --  20.2* 20.5*  HGB 9.7* 7.1* 8.5*  HCT 31.4* 22.6* 25.6*  MCV 90.8 91.5 90.8  PLT 215 96*  86*    Cardiac Enzymes: No results for input(s): CKTOTAL, CKMB, CKMBINDEX, TROPONINI in the last 168 hours.  BNP: Invalid input(s): POCBNP  CBG: Recent Labs  Lab 07/25/2020 0425 08/01/2020 0721  GLUCAP 202* 193*    Microbiology: Results for orders placed or performed during the hospital encounter of 07/12/2020  Resp Panel by RT PCR (RSV, Flu A&B, Covid) -     Status: None   Collection Time: 07/24/2020 11:04 PM  Result Value Ref Range Status   SARS Coronavirus 2 by RT PCR NEGATIVE NEGATIVE Final   Influenza A by PCR NEGATIVE NEGATIVE Final   Influenza B by PCR NEGATIVE NEGATIVE Final   Respiratory Syncytial Virus by PCR NEGATIVE NEGATIVE Final    Comment: Performed at [facility] [address] [place], [place] [address]  MRSA PCR Screening     Status: None   Collection Time: 07/25/2020  2:19 AM   Specimen: Nasopharyngeal  Result Value Ref Range Status   MRSA by PCR NEGATIVE NEGATIVE Final    Comment:        The GeneXpert MRSA Assay (FDA approved for NASAL specimens only), is one component of a comprehensive MRSA colonization surveillance program. It is not intended to diagnose MRSA infection nor to guide or monitor treatment for MRSA infections. Performed at [facility], [address] [place], [place] [address]   CULTURE, BLOOD (ROUTINE X 2) w Reflex to ID Panel     Status: None (Preliminary result)   Collection Time: 07/12/2020  3:42 AM   Specimen: BLOOD  Result Value Ref Range Status   Specimen Description BLOOD RIGHT HAND  Final   Special Requests   Final    BOTTLES DRAWN AEROBIC ONLY Blood Culture results may not be optimal due to an inadequate volume of blood received in culture bottles   Culture   Final    NO GROWTH <12 HOURS Performed at [facility], [address], [place], [place] [address]    Report Status PENDING  Incomplete    Coagulation Studies: Recent Labs    07/25/2020 2327 07/08/2020 0826  LABPROT 14.8 23.1*  INR  1.2 2.1*    Urinalysis: Recent Labs    07/03/2020 2024  COLORURINE AMBER*  LABSPEC 1.023  PHURINE 5.0  GLUCOSEU NEGATIVE  HGBUR NEGATIVE  BILIRUBINUR NEGATIVE  KETONESUR NEGATIVE  PROTEINUR 30*  NITRITE NEGATIVE  LEUKOCYTESUR NEGATIVE      Imaging: CT ABDOMEN PELVIS WO CONTRAST  Result Date: 07/14/2020 CLINICAL DATA:  Nausea, vomiting EXAM: CT ABDOMEN AND PELVIS WITHOUT CONTRAST TECHNIQUE: Multidetector CT imaging of the abdomen and pelvis was performed following the standard protocol without IV contrast. COMPARISON:  None. FINDINGS: Lower chest: There is asymmetric ground-glass pulmonary infiltrate within the right lung base, nonspecific, possibly infectious or inflammatory in nature. Superimposed minimal subpleural pulmonary fibrosis. The visualized heart and pericardium are  unremarkable. Hepatobiliary: The liver is unremarkable. No intra or extrahepatic biliary ductal dilation. Layering high density material within the gallbladder lumen likely represent small gallstones or hyperdense sludge. Pancreas: Unremarkable Spleen: Unremarkable Adrenals/Urinary Tract: There is extensive right perinephric stranding likely representing infiltration related to the retroperitoneal hemorrhage described more fully below. This obscures the right adrenal gland. Left adrenal gland is unremarkable. Kidneys are otherwise unremarkable. Bladder is unremarkable. Stomach/Bowel: The stomach, small bowel, and large bowel are unremarkable. The appendix is not clearly identified and is likely absent. Small free fluid is seen within the pelvis, nonspecific. No free intraperitoneal gas. Vascular/Lymphatic: A fusiform infrarenal abdominal aortic aneurysm is present measuring 7.3 x 7.5 x 9.5 cm in greatest dimension roughly 55 degree posterior angulation at the proximal neck and roughly 85 degree angulation between the aneurysm sac and the right common iliac artery. There is a large amount of hyperdense material seen  anteriorly and right lateral to the aneurysm sac in keeping with acute hemorrhage within the retroperitoneum secondary to aneurysm rupture. Infiltrating hemorrhagic material extends into the right anterior and posterior pararenal space and subsequently into the right hemipelvis. 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 Extensive superimposed atherosclerotic calcification within the lower extremity arterial inflow. No pathologic adenopathy within the abdomen and pelvis. Reproductive: Uterus and bilateral adnexa are unremarkable. Other: Rectum unremarkable. Musculoskeletal: No acute bone abnormality. IMPRESSION: Ruptured 7.5 cm infrarenal abdominal aortic aneurysm with extensive retroperitoneal hemorrhage. Significant angulation at the juncture of the aneurysm and right common iliac artery. Moderate angulation involving the proximal neck and body of the aneurysm. These results were called by telephone at the time of interpretation on 07/31/2020 at 10:28 pm to provider [facility] , who verbally acknowledged these results. Electronically Signed   By: Fidela Salisbury MD   On: 07/06/2020 22:30   PERIPHERAL VASCULAR CATHETERIZATION  Result Date: 07/06/2020 See op note  DG Chest Port 1 View  Result Date: 07/06/2020 CLINICAL DATA:  Central line placement EXAM: PORTABLE CHEST 1 VIEW COMPARISON:  Earlier today FINDINGS: Left IJ line with tip at the brachiocephalic SVC junction. No new mediastinal widening or pneumothorax. The enteric tube tip is at the stomach. Endotracheal tube tip is at the clavicular heads. Stable low volume chest with mild atelectatic type density. Stable heart size with aortic tortuosity accentuated by rotation. IMPRESSION: 1. New central line with tip near the SVC brachiocephalic junction. No pneumothorax. 2. Otherwise stable. Electronically Signed   By: Monte Fantasia M.D.   On: 07/25/2020 05:05   Portable Chest x-ray  Result Date: 07/07/2020 CLINICAL DATA:   Post intubation.  OG tube placement. EXAM: PORTABLE CHEST 1 VIEW COMPARISON:  October 27, 2014 FINDINGS: The endotracheal tube terminates above the carina. The enteric tube appears to terminate over the gastric body. The heart size is borderline enlarged. There are hazy bilateral airspace opacities, greatest in the right lower lobe. Aortic calcifications are noted. The mediastinum appears somewhat widened which may be projectional are due to low lung volumes. IMPRESSION: 1. Endotracheal tube terminates above the carina. 2. Enteric tube terminates over the gastric body. 3. Hazy bilateral airspace opacities, greatest in the right lower lobe, may represent atelectasis or infiltrates. Electronically Signed   By: Constance Holster M.D.   On: 07/04/2020 02:51      Assessment & Plan: Pt is a 69 y.o. female with a PMHx of hypertension, hyperlipidemia, prediabetes, who was admitted to [facility] on 07/07/2020 for evaluation of abdominal pain.  Initial rapid evaluation  revealed hemorrhagic shock in the setting of ruptured abdominal aortic aneurysm with extensive retroperitoneal hemorrhage.  1.  Acute kidney injury secondary to acute tubular necrosis from ruptured AAA and subsequent hemorrhagic shock.  Patient with significant acute kidney injury.  Also noted to be hyperkalemic.  Patient has significant metabolic and lactic acidosis.  Therefore recommend initiation of CRRT as the patient is on multiple pressors and has hemorrhagic shock.  Temporary dialysis catheter to be placed and we will proceed with continuous renal placement therapy subsequently.  2.  Acute respiratory failure.  Currently on the ventilator.  Vent support as per pulmonary/critical care.  3.  Hemorrhagic shock.  Patient currently maintained on norepinephrine, phenylephrine, and vasopressin.  Maintain MAP of 65 or as per vascular surgery and pulmonary/critical care.  4.  Metabolic acidosis.  Patient with significant lactic acidosis.  Currently on  bicarbonate drip at 150 cc/h.  CRRT to be added as well.  5.  Hyperkalemia.  Serum potassium up to 6.5.  Dialyze the patient against a 2K bath for now.  We will likely need to switch to 4K once corrected.  6.  Overall prognosis guarded.  Thanks for consultation.

## 2020-07-10 NOTE — Progress Notes (Signed)
[facility] Daily Progress Note  Subjective: 07/12/2020: 1. US guidance for vascular access, bilateral femoral arteries 2. Catheter placement into aorta from bilateral femoral approaches 3. Placement of a 28 mm x 12 x cm length Gore Excluder Endoprosthesis main body left with a 14 mm diameter by 14 cm length right iliac bridge piece and then a 27 mm diameter by 14 cm length right iliac limb contralateral limb 4. Placement of a 14 mm diameter by 14 cm length left iliac extension limb 5. Placement of a 28 mm diameter proximal aortic cuff 6. ProGlide closure devices bilateral femoral arteries  Intubated and sedated.  Objective: Vitals:   07/09/2020 0627 07/23/2020 0642 07/29/2020 0700 07/24/2020 0706  BP:  102/78 (!) 153/78 (!) 153/78  Pulse: (!) 125 (!) 125 (!) 125 (!) 125  Resp: (!) 24 (!) 24 (!) 21 (!) 25  Temp: (!) 97.5 F (36.4 C) 97.9 F (36.6 C) (!) 97.2 F (36.2 C) 97.9 F (36.6 C)  TempSrc:      SpO2: 100% 100% 100% 100%  Weight:      Height:        Intake/Output Summary (Last 24 hours) at 07/15/2020 1058 Last data filed at 07/24/2020 0706 Gross per 24 hour  Intake 5845.64 ml  Output 425 ml  Net 5420.64 ml   Physical Exam: Intubated and sedated.  On vent. CV: Tachycardic Pulmonary: CTA Bilaterally Abdomen: Soft, Distended Right groin:  Access site: PAD intact. Clean dry and intact.  No swelling or drainage. Left groin:  Access site: PAD intact. Clean dry and intact.  No swelling or drainage. Vascular: Warm distally to toes.   Laboratory: CBC    Component Value Date/Time   WBC 24.9 (H) 07/31/2020 0826   HGB 8.5 (L) 07/15/2020 0826   HCT 25.6 (L) 07/27/2020 0826   PLT 86 (L) 07/04/2020 0826   BMET    Component Value Date/Time   NA 143 07/21/2020 0831   K 6.5 (HH) 07/07/2020 0831   CL 107 07/09/2020 0831   CO2 23 07/09/2020 0831   GLUCOSE 180 (H) 07/13/2020 0831   BUN 33 (H) 07/06/2020 0831   CREATININE 2.28 (H) 07/29/2020 0831   CALCIUM  6.2 (LL) 07/03/2020 0831   GFRNONAA 21 (L) 07/23/2020 0831   Assessment/Planning: The patient is a 69 year old female who presented with ruptured abdominal aortic aneurysm status post endovascular aneurysm repair with stent graft - day or surgery  1) patient remains critically ill on vent requiring multiple pressors 2) worsening acute kidney failure now requiring ultrafiltration 3) worsening lactic acidosis requiring bicarb 4) continued anemia requiring transfusion 5) poor prognosis, Dr. Lucky Cowboy had another long conversation with family at bedside this AM discussing the patient's diagnosis, procedure, critical status and prognosis  Appreciate care/assistance in managing the patient.  Discussed with Dr. Ellis Parents Azayla Polo PA-C 07/10/2020 10:58 AM

## 2020-07-10 NOTE — Procedures (Signed)
Central Venous Catheter Insertion Procedure Note  Isabel Castro  [id number]  13-Sep-1951  Date:07/27/2020  Time:4:22 AM   Provider Performing:Savi Lastinger D Dewaine Conger   Procedure: Insertion of Non-tunneled Central Venous Catheter(36556) with US guidance (34961)   Indication(s) Medication administration and Difficult access  Consent Unable to obtain consent due to emergent nature of procedure.  Anesthesia Topical only with 1% lidocaine   Timeout Verified patient identification, verified procedure, site/side was marked, verified correct patient position, special equipment/implants available, medications/allergies/relevant history reviewed, required imaging and test results available.  Sterile Technique Maximal sterile technique including full sterile barrier drape, hand hygiene, sterile gown, sterile gloves, mask, hair covering, sterile ultrasound probe cover (if used).  Procedure Description Area of catheter insertion was cleaned with chlorhexidine and draped in sterile fashion.  With real-time ultrasound guidance a central venous catheter was placed into the left internal jugular vein. Nonpulsatile blood flow and easy flushing noted in all ports.  The catheter was sutured in place and sterile dressing applied.  Complications/Tolerance None; patient tolerated the procedure well. Chest X-ray is ordered to verify placement for internal jugular or subclavian cannulation.   Chest x-ray is not ordered for femoral cannulation.  EBL Minimal  Specimen(s) None   Line secured at the 20 cm mark.  BIOPATCH applied to the insertion site.     Darel Hong, AGACNP-BC [facility] Pager: [phone]

## 2020-07-10 NOTE — Progress Notes (Signed)
*  PRELIMINARY RESULTS* Echocardiogram 2D Echocardiogram has been performed.  Isabel Castro Isabel Castro Isabel Castro Isabel Castro 07/24/2020, 10:48 AM

## 2020-07-10 NOTE — Progress Notes (Signed)
eLink Physician-Brief Progress Note Patient Name: Joelyn Lover DOB: 06-03-1951 MRN: [id number]   Date of Service  07/18/2020  HPI/Events of Note  Patient with ruptured intra-abdominal AAA with extensive retro-peritoneal hematoma, taken emergently to the OR for endovascular repair which was successful, patient admitted to the ICU with post-operative respiratory failure on the ventilator.  eICU Interventions  New Patient Evaluation completed.        Kerry Kass Jacen Carlini 07/05/2020, 3:19 AM

## 2020-07-10 NOTE — Progress Notes (Signed)
Patient taken to the ICU immediately postoperatively. Hemodynamically stable. On the ventilator. I have tried reaching her daughter by her cell phone, searching for her in the recovery area, calling the emergency room in the ICU but have been unable to locate her. She did not answer the phone when I called in her voice mailbox was full. Left a message with ICU to let the patient's family know an update and I will be happy to try to talk to her if available.

## 2020-07-10 NOTE — Consult Note (Signed)
Name: Isabel Castro MRN: [id number] DOB: Oct 12, 1950    ADMISSION DATE:  07/06/2020 CONSULTATION DATE:  07/17/2020  REFERRING MD :  Dr. Lucky Cowboy  CHIEF COMPLAINT:  Abdominal Pain  BRIEF PATIENT DESCRIPTION:  69 y.o. Female admitted with Hemorrhagic shock in the setting of Ruptured AAA with extensive retroperitoneal hemorrhage requiring emergent Endovascular Repair.  Returns to ICU post procedure, and remains intubated.  How with severe metabolic acidosis and AKI, Nephrology consulted.  SIGNIFICANT EVENTS  10/7: Presents to ED; found to have ruptured AAA; taken emergently for Endovascular repair 10/8: Returns to ICU post procedure, remains intubated; Hypotensive, PCCM consulted 10/8: Left IJ CVC placed; Nephrology consulted for developing AKI; critically ill  STUDIES:  10/7: CT Abdomen & Pelvis>>Ruptured 7.5 cm infrarenal abdominal aortic aneurysm with extensive retroperitoneal hemorrhage. Significant angulation at the juncture of the aneurysm and right common iliac artery. Moderate angulation involving the proximal neck and body of the aneurysm. 10/8: CXR>>1. Endotracheal tube terminates above the carina. 2. Enteric tube terminates over the gastric body. 3. Hazy bilateral airspace opacities, greatest in the right lower lobe, may represent atelectasis or infiltrates.  CULTURES: SARS-CoV-2 PCR 10/7>> negative Influenza PCR 10/7>>negative MRSA PCR 10/8>> Blood culture x2 10/8>> Tracheal aspirate 10/8>> Urine 10/8>>  ANTIBIOTICS: Cefazolin (surgical prophylaxis) 10/7 x1 dose Zosyn 10/8>> Vancomycin 10/8>>   HISTORY OF PRESENT ILLNESS:   Isabel Castro is a 69 y.o. Female with a past medical history of Pre-diabetes and Hyperlipidemia who presents to [facility] ED on 07/14/2020 due to complaints of progressive abdominal pain that began approximately 8 hours prior to presentation.  She described the pain as 9/10 in severity, and radiating through to her back.  She also reported associated  nausea and vomiting.  Vital signs upon presentation included Temperature 98.6, HR 106, BP 101/79, RR 18, and SpO2 100% on room air.  Initial workup revealed Hgb 9.7, bicarb 18, anion gap 18, lactic acid 6.5, BUN 30, and Creatinine 1.83.  Bleeding times were unremarkable.  CT Abdomen & Pelvis was obtained which revealed a Ruptured 7.5 cm infrarenal AAA with extensive retroperitoneal hemorrhage.  Vascular Surgery was consulted emergently, and was she taken urgently for Endovascular Repair of the AAA.  During the procedure she was noted to be hypotensive requiring Neo-synephrine and Vasopressin, along with 2 units of blood.  Post procedure she remains intubated, and returns to ICU.  Initially upon arrival to ICU she was hemodynamically stable, but become hypotensive requiring vasopressors.  Found to have severe metabolic acidosis.  PCCM is consulted for further workup and treatment of Hemorrhagic shock due to Ruptured AAA with extensive retroperitoneal hemorrhage requiring emergent Endovascular Repair, along with Postop Ventilator Management.  PAST MEDICAL HISTORY :   has a past medical history of Hyperlipidemia and Pre-diabetes.  has a past surgical history that includes Colonoscopy with propofol (N/A, 10/08/2018). Prior to Admission medications   Medication Sig Start Date End Date Taking? Authorizing Provider  atorvastatin (LIPITOR) 10 MG tablet Take 10 mg by mouth daily.   Yes [provider]  Cyanocobalamin (VITAMIN B 12) 500 MCG TABS Take 500 mg by mouth daily.   Yes [provider]  ELDERBERRY PO Take by mouth.   Yes [provider]  Multiple Vitamin (MULTIVITAMIN) tablet Take 1 tablet by mouth daily.   Yes [provider]  erythromycin ophthalmic ointment Place 1 application into the left eye at bedtime. Patient not taking: Reported on 07/07/2020    [provider]  fluticasone (FLONASE) 50 MCG/ACT nasal spray  Place 2 sprays into both nostrils 2 (two)  times daily. Patient not taking: Reported on 07/22/2020    [provider]  meloxicam (MOBIC) 15 MG tablet Take 1 tablet (15 mg total) by mouth daily. Patient not taking: Reported on 10/08/2018 06/09/17   Frederich Cha, MD  nicotine (NICODERM CQ - DOSED IN MG/24 HOURS) 21 mg/24hr patch Place 21 mg onto the skin daily. Patient not taking: Reported on 07/18/2020    [provider]  olopatadine (PATANOL) 0.1 % ophthalmic solution Place 1 drop into both eyes 2 (two) times daily. Patient not taking: Reported on 07/15/2020    [provider]  tiZANidine (ZANAFLEX) 4 MG capsule Take 1 capsule (4 mg total) by mouth 3 (three) times daily. Patient not taking: Reported on 10/08/2018 06/09/17   Frederich Cha, MD   No Known Allergies  FAMILY HISTORY:  family history is not on file. SOCIAL HISTORY:  reports that she has quit smoking. Her smoking use included cigarettes. She has a 15.00 pack-year smoking history. She has never used smokeless tobacco. She reports current alcohol use. She reports that she does not use drugs.   COVID-19 DISASTER DECLARATION:  FULL CONTACT PHYSICAL EXAMINATION WAS NOT POSSIBLE DUE TO TREATMENT OF COVID-19 AND  CONSERVATION OF PERSONAL PROTECTIVE EQUIPMENT, LIMITED EXAM FINDINGS INCLUDE-  Patient assessed or the symptoms described in the history of present illness.  In the context of the Global COVID-19 pandemic, which necessitated consideration that the patient might be at risk for infection with the SARS-CoV-2 virus that causes COVID-19, Institutional protocols and algorithms that pertain to the evaluation of patients at risk for COVID-19 are in a state of rapid change based on information released by regulatory bodies including the CDC and federal and state organizations. These policies and algorithms were followed during the patient's care while in hospital.  REVIEW OF SYSTEMS:   Unable to assess due to intubation and sedation  SUBJECTIVE:  Unable to  assess due to intubation and sedation  VITAL SIGNS: Temp:  [98.6 F (37 C)] 98.6 F (37 C) (10/07 1935) Pulse Rate:  [106-124] 117 (10/07 2331) Resp:  [18-35] 35 (10/07 2300) BP: (101-134)/(46-90) 106/46 (10/07 2331) SpO2:  [98 %-100 %] 100 % (10/07 2331) Weight:  [83.9 kg] 83.9 kg (10/07 1936)  PHYSICAL EXAMINATION: General:  Acutely ill appearing female, laying in bed, intubated and sedated, in NAD Neuro: Heavily sedated (received paralytic prior to transfer to ICU) HEENT: Atraumatic, normocephalic, neck supple, no JVD Cardiovascular: Tachycardia, regular rhythm, S1-S2, no murmurs, rubs, or gallops Lungs:  Coarse breath sounds bilaterally, no wheezing, vent assisted, even Abdomen:  Soft, distended, no guarding or rebound tenderness Musculoskeletal:  Normal bulk and tone, no deformities, no edema Skin:  Cool and dry.  Bilateral femoral incision sites clean, dry and intact with PAD's in place.  No obvious rashes, lesions, or ulcerations  Recent Labs  Lab 07/17/2020 2024  NA 140  K 4.1  CL 104  CO2 18*  BUN 30*  CREATININE 1.83*  GLUCOSE 184*   Recent Labs  Lab 07/18/2020 2024  HGB 9.7*  HCT 31.4*  WBC 10.2  PLT 215   CT ABDOMEN PELVIS WO CONTRAST  Result Date: 07/17/2020 CLINICAL DATA:  Nausea, vomiting EXAM: CT ABDOMEN AND PELVIS WITHOUT CONTRAST TECHNIQUE: Multidetector CT imaging of the abdomen and pelvis was performed following the standard protocol without IV contrast. COMPARISON:  None. FINDINGS: Lower chest: There is asymmetric ground-glass pulmonary infiltrate within the right lung base, nonspecific, possibly infectious or  inflammatory in nature. Superimposed minimal subpleural pulmonary fibrosis. The visualized heart and pericardium are unremarkable. Hepatobiliary: The liver is unremarkable. No intra or extrahepatic biliary ductal dilation. Layering high density material within the gallbladder lumen likely represent small gallstones or hyperdense sludge. Pancreas:  Unremarkable Spleen: Unremarkable Adrenals/Urinary Tract: There is extensive right perinephric stranding likely representing infiltration related to the retroperitoneal hemorrhage described more fully below. This obscures the right adrenal gland. Left adrenal gland is unremarkable. Kidneys are otherwise unremarkable. Bladder is unremarkable. Stomach/Bowel: The stomach, small bowel, and large bowel are unremarkable. The appendix is not clearly identified and is likely absent. Small free fluid is seen within the pelvis, nonspecific. No free intraperitoneal gas. Vascular/Lymphatic: A fusiform infrarenal abdominal aortic aneurysm is present measuring 7.3 x 7.5 x 9.5 cm in greatest dimension roughly 55 degree posterior angulation at the proximal neck and roughly 85 degree angulation between the aneurysm sac and the right common iliac artery. There is a large amount of hyperdense material seen anteriorly and right lateral to the aneurysm sac in keeping with acute hemorrhage within the retroperitoneum secondary to aneurysm rupture. Infiltrating hemorrhagic material extends into the right anterior and posterior pararenal space and subsequently into the right hemipelvis. 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 Extensive superimposed atherosclerotic calcification within the lower extremity arterial inflow. No pathologic adenopathy within the abdomen and pelvis. Reproductive: Uterus and bilateral adnexa are unremarkable. Other: Rectum unremarkable. Musculoskeletal: No acute bone abnormality. IMPRESSION: Ruptured 7.5 cm infrarenal abdominal aortic aneurysm with extensive retroperitoneal hemorrhage. Significant angulation at the juncture of the aneurysm and right common iliac artery. Moderate angulation involving the proximal neck and body of the aneurysm. These results were called by telephone at the time of interpretation on 07/03/2020 at 10:28 pm to provider [facility] , who verbally  acknowledged these results. Electronically Signed   By: Fidela Salisbury MD   On: 07/26/2020 22:30   PERIPHERAL VASCULAR CATHETERIZATION  Result Date: 08/01/2020 See op note   ASSESSMENT / PLAN:  Postop Ventilator Management -Full vent support -Wean FiO2 and PEEP as tolerated to maintain O2 sats >92% -Follow intermittent CXR & ABG as needed -VAP protocol -Spontaneous breathing trials when respiratory parameters met (and determination by Vascular Surgery that pt requires no further surgical intervention) -Prn Bronchodilators -CXR on 10/8 with bilateral hazy bilateral airspace opacities, suspect likely atelectasis following surgical procedure    Hemorrhagic shock in the setting of Ruptured AAA ? Septic shock Mildly elevated Troponin, suspect due to demand ischemia -Continuous cardiac monitoring -Maintain MAP >65 -IV Fluids -Transfusions as indicated -Vasopressors if needed to maintain MAP goal -Stress dose steroids -Check CVP ~ 14 -Trend lactic acid -Will perform sepsis workup as well -Trend Troponin until downtrending -Obtain 2D Echocardiogram   Meets SIRS Criteria Leukocytosis w/ Left shift ? Intraabdominal infection -Monitor fever curve -Trend WBC's and Procalcitonin -Follow pan cultures as above -Place on empiric Zosyn and Vancomycin for now    Ruptured AAA with Retroperitoneal Hemorrhage -Vascular Surgery following, follow up recommendations -S/p Endovascular repair 07/16/2020   Acute Blood loss anemia -Monitor for S/Sx of bleeding -Trend CBC (H&H q6h) -SCD's for VTE Prophylaxis  -Transfuse for Hgb <8 -Pt received 2 units pRBC's intraoperatively and 1 unit in ICU on 10/8;  repeat H&H pending   AKI Severe Anion Gap Metabolic Acidosis in setting of Lactic Acidosis Hyperkalemia Mild Hypernatremia>>resolved -Monitor I&O's / urinary output -Follow BMP -Ensure adequate renal perfusion -Avoid nephrotoxic agents as able -Replace electrolytes as indicated -IV  fluids -Trend lactic acid -Given 2 amps bicarb 10/8 -Start bicarb gtt -Will give insulin, bicarb, and [place] -Consult Nephrology, appreciate input -Discussed with Dr. Holley Raring, pt likely needs CRRT   Sedation Needs in setting of Mechanical Ventilation -Maintain RASS goal of 0 to -1 -Fentanyl gtt, prn versed -Daily wake up assessments -Provide supportive care   Hyperglycemia Hx: Prediabetes -CBG's -SSI -Follow ICU Hypo/hyperglycemia protocol -Check Hgb A1c       Pt is critically ill, prognosis is extremely guarded.  She is at high risk for cardiac arrest and death.  BEST PRACTICES DISPOSITION: ICU GOALS OF CARE: Full Code VTE PROPHYLAXIS: SCD's SUP: IV Pepcid CONSULTS: Vascular Surgery, Nephrology UPDATES: No family at bedside during NP rounds  Darel Hong, [facility] Pager: [phone]  08/01/2020, 1:57 AM

## 2020-07-10 NOTE — Anesthesia Postprocedure Evaluation (Signed)
Anesthesia Post Note  Patient: Isabel Castro  Procedure(s) Performed: ENDOVASCULAR REPAIR/STENT GRAFT (N/A )  Patient location during evaluation: SICU Anesthesia Type: General Level of consciousness: sedated Pain management: pain level controlled Vital Signs Assessment: post-procedure vital signs reviewed and stable Respiratory status: patient remains intubated per anesthesia plan and patient on ventilator - see flowsheet for VS Cardiovascular status: stable Postop Assessment: no apparent nausea or vomiting Anesthetic complications: no Comments: Patient remains critically ill in ICU.  BP stable on phenylephrine, vasopressin, and norepinephrine infusions.  Bicarbonate solution and a unit of PRBC also being given at this time.  Patient on fentanyl infusion for sedation.   No complications documented.   Last Vitals:  Vitals:   07/09/2020 0627 07/29/2020 0642  BP:  102/78  Pulse: (!) 125 (!) 125  Resp: (!) 24 (!) 24  Temp: (!) 36.4 C 36.6 C  SpO2: 100% 100%    Last Pain:  Vitals:   07/03/2020 0530  TempSrc:   PainSc: 0-No pain  BP 102/78 on pressors               Lia Foyer

## 2020-07-10 NOTE — Procedures (Signed)
Central Venous Dailysis Catheter Placement: TRIPLE LUMEN    Indication(s) Hemo Dialysis/CRRT  Consent-verbal Risks of the procedure as well as the alternatives and risks of each were explained to the patient and/or caregiver.  Consent for the procedure was obtained and is signed in the bedside chart    Timeout Verified patient identification, verified procedure, site/side was marked, verified correct patient position, special equipment/implants available, medications/allergies/relevant history reviewed, required imaging and test results available. Patient comfort was obtained.     Sterile Technique Maximal sterile technique including full sterile barrier drape, hand hygiene, sterile gown, sterile gloves, mask, hair covering, sterile ultrasound probe cover (if used).   Hand washing performed prior to starting the procedure.    Procedure Description Area of catheter insertion was cleaned with chlorhexidine and draped in sterile fashion.  With real-time ultrasound guidance a central venous catheter was placed into the vein. Nonpulsatile blood flow and easy flushing noted in all ports.  The catheter was sutured in place and sterile dressing applied.   A triple lumen catheter was placed in RT Internal Jugular Vein There was good blood return, catheter caps were placed on lumens, catheter flushed easily, the line was secured and a sterile dressing and BIO-PATCH applied.   Ultrasound was used to visualize vasculature and guidance of needle.   Number of Attempts: 1 Complications:none  Estimated Blood Loss: none Operator: Shiya Fogelman.   Corrin Parker, M.D.  [facility]  Medical Director [place] Director [facility] Cardio-Pulmonary Department

## 2020-07-10 NOTE — Plan of Care (Signed)

## 2020-07-10 NOTE — Progress Notes (Signed)
Pt with increasing Vasopressor requirements (Neo, Levophed, Vasopressin) and worsening lactic acidosis (lactic up to 10.3) despite resuscitation and CVP 14.  Pt's abdomen is severely distended.  Discussed with Dr. Lucky Cowboy, will obtain bladder pressure and repeat CT Abdomen/Pelvis.  Repeat Hemoglobin 7.1, will give an additional unit of pRBC's.      Darel Hong, AGACNP-BC [place] [facility] Pager: [phone]

## 2020-07-10 NOTE — Transfer of Care (Signed)
Immediate Anesthesia Transfer of Care Note  Patient: Isabel Castro  Procedure(s) Performed: ENDOVASCULAR REPAIR/STENT GRAFT (N/A )  Patient Location: ICU  Anesthesia Type:General  Level of Consciousness: Patient remains intubated per anesthesia plan  Airway & Oxygen Therapy: Patient remains intubated per anesthesia plan  Post-op Assessment: Report given to RN and Post -op Vital signs reviewed and stable  Post vital signs: Reviewed  Last Vitals:  Vitals Value Taken Time  BP 131/74 07/07/2020 0211  Temp    Pulse 108 07/21/2020 0211  Resp 17 07/15/2020 0211  SpO2 100 % 07/29/2020 0211  Vitals shown include unvalidated device data.  Last Pain:  Vitals:   07/13/2020 1935  TempSrc: Oral  PainSc: 8          Complications: No complications documented.

## 2020-07-10 NOTE — Progress Notes (Signed)
After CRRT restarted, patient again had BP drop despite vasopressors at max dosage, patient in trendelenberg, low blood flow rates and no fluid removal. Notified Dr. Mortimer Fries and Dr. Holley Raring. Per Dr. Mortimer Fries, 1 amp of bicarb given and stat lactic acid and ABG drawn, and vasopressors increased again. Per Dr. Holley Raring, bolus of 500 mL from patient's bicarb infusion started for patient. Dr. Lucky Cowboy and Herbert Moors PA came to bedside to further assess patient as well.

## 2020-07-10 NOTE — Significant Event (Signed)
Arrived at bedside pt PEA arrested requiring CPR and ACLS protocol with ROSC 6 minutes later.  CRRT stopped during code blue, notified Dr. Holley Raring post cardiac arrest pt remains on maximal doses of levophed, vasopressin, and neo-synephrine gtts.  Pupils dilated and non reactive.  CRRT on hold due to pt instability, if pt stabilizes will resume CRRT stat BMP and h&h pending, Dr. Holley Raring agreed with plan. Notified pts daughter Jalaya Sarver and pts sister Alberteen Spindle regarding decline in pts condition, pts daughter stated she would arrive at bedside shortly.  Will continue to monitor and assess pt.  Marda Stalker, [place] Pager [phone] (please enter 7 digits) PCCM Consult Pager [phone] (please enter 7 digits)

## 2020-07-10 NOTE — Progress Notes (Signed)
Pharmacy Antibiotic Note  Isabel Castro is a 68 y.o. female admitted on 07/12/2020 with intra abdominal .  Pharmacy has been consulted for Vanc, Zosyn  dosing.  Plan: Vancomycin 2 gm IV X 1 ordered for 10/8 @ 0600.  Vancomycin 1250 mg IV Q24H ordered to start on 10/9 @ 0600.   Zosyn 3.375 gm IV Q8H EI ordered to start on 10/8 @ 0600.   Height: 5\' 3"  (160 cm) Weight: 83.9 kg (185 lb) IBW/kg (Calculated) : 52.4  Temp (24hrs), Avg:95.1 F (35.1 C), Min:94.3 F (34.6 C), Max:98.6 F (37 C)  Recent Labs  Lab 08/02/2020 2024 07/06/2020 2201 08/01/2020 2300 08/01/2020 0450  WBC 10.2  --   --  25.2*  CREATININE 1.83*  --   --   --   LATICACIDVEN  --  6.5* 9.1* 10.3*    Estimated Creatinine Clearance: 30.2 mL/min (A) (by C-G formula based on SCr of 1.83 mg/dL (H)).    No Known Allergies  Antimicrobials this admission:   >>    >>   Dose adjustments this admission:   Microbiology results:  BCx:   UCx:    Sputum:    MRSA PCR:   Thank you for allowing pharmacy to be a part of this patient's care.  Meliah Appleman D 07/20/2020 6:00 AM

## 2020-07-10 NOTE — Progress Notes (Signed)
GOALS OF CARE DISCUSSION  The Clinical status was relayed to family in detail-Daughter and Sister of patient  Updated and notified of patients medical condition.   patient with increased WOB and using accessory muscles to breathe Explained to family course of therapy and the modalities     Patient with Progressive multiorgan failure with possible very likely low probability  of meaningful recovery despite all aggressive and optimal medical therapy. Patient is in the Dying  Process associated with Suffering.  Family understands the situation.  Patient remains full code  Family are satisfied with Plan of action and management. All questions answered    Corrin Parker, M.D.  [facility]  Medical Director [place] Director [facility] Cardio-Pulmonary Department

## 2020-07-10 NOTE — Anesthesia Procedure Notes (Signed)
Procedure Name: Intubation Performed by: Rolla Plate, CRNA Pre-anesthesia Checklist: Patient identified, Patient being monitored, Timeout performed, Emergency Drugs available and Suction available Patient Re-evaluated:Patient Re-evaluated prior to induction Oxygen Delivery Method: Circle system utilized Preoxygenation: Pre-oxygenation with 100% oxygen Induction Type: IV induction, Rapid sequence and Cricoid Pressure applied Laryngoscope Size: 3 and McGraph Grade View: Grade I Tube type: Oral Tube size: 7.0 mm Number of attempts: 1 Airway Equipment and Method: Stylet and Video-laryngoscopy Placement Confirmation: ETT inserted through vocal cords under direct vision,  positive ETCO2 and breath sounds checked- equal and bilateral Secured at: 21 cm Tube secured with: Tape Dental Injury: Teeth and Oropharynx as per pre-operative assessment

## 2020-07-10 NOTE — Progress Notes (Signed)
Patient's blood pressure began to drop after CRRT started. Blood flow rate lowered to 200 mL/hr, fluid removal rate at 0 mL/hr. Vasopressors increased and Dr. Mortimer Fries notified. CRRT machine will thermax device that malfunctions and unable to fix even with assistance from manufacturer. CRRT had to be stopped and machine refused for blood to be restarted. Per order gave patient's 2 amps of bicarb which improved BP and drew stat H&H.

## 2020-07-11 DIAGNOSIS — Z7189 Other specified counseling: Secondary | ICD-10-CM

## 2020-07-11 DIAGNOSIS — Q8789 Other specified congenital malformation syndromes, not elsewhere classified: Secondary | ICD-10-CM | POA: Diagnosis not present

## 2020-07-11 DIAGNOSIS — J9601 Acute respiratory failure with hypoxia: Secondary | ICD-10-CM | POA: Diagnosis not present

## 2020-07-11 DIAGNOSIS — I713 Abdominal aortic aneurysm, ruptured: Secondary | ICD-10-CM | POA: Diagnosis not present

## 2020-07-11 LAB — URINE CULTURE: Culture: NO GROWTH

## 2020-07-11 LAB — CBC WITH DIFFERENTIAL/PLATELET
Abs Immature Granulocytes: 0.27 10*3/uL — ABNORMAL HIGH (ref 0.00–0.07)
Basophils Absolute: 0.1 10*3/uL (ref 0.0–0.1)
Basophils Relative: 0 %
Eosinophils Absolute: 2 10*3/uL — ABNORMAL HIGH (ref 0.0–0.5)
Eosinophils Relative: 14 %
HCT: 28.1 % — ABNORMAL LOW (ref 36.0–46.0)
Hemoglobin: 9.3 g/dL — ABNORMAL LOW (ref 12.0–15.0)
Immature Granulocytes: 2 %
Lymphocytes Relative: 6 %
Lymphs Abs: 0.8 10*3/uL (ref 0.7–4.0)
MCH: 31.4 pg (ref 26.0–34.0)
MCHC: 33.1 g/dL (ref 30.0–36.0)
MCV: 94.9 fL (ref 80.0–100.0)
Monocytes Absolute: 0.5 10*3/uL (ref 0.1–1.0)
Monocytes Relative: 4 %
Neutro Abs: 10.6 10*3/uL — ABNORMAL HIGH (ref 1.7–7.7)
Neutrophils Relative %: 74 %
Platelets: 35 10*3/uL — ABNORMAL LOW (ref 150–400)
RBC: 2.96 MIL/uL — ABNORMAL LOW (ref 3.87–5.11)
RDW: 16.4 % — ABNORMAL HIGH (ref 11.5–15.5)
WBC Morphology: INCREASED
WBC: 14.2 10*3/uL — ABNORMAL HIGH (ref 4.0–10.5)
nRBC: 0.9 % — ABNORMAL HIGH (ref 0.0–0.2)

## 2020-07-11 LAB — COMPREHENSIVE METABOLIC PANEL
ALT: 12415 U/L — ABNORMAL HIGH (ref 0–44)
AST: >10000 U/L — ABNORMAL HIGH (ref 15–41)
Albumin: 1.6 g/dL — ABNORMAL LOW (ref 3.5–5.0)
Alkaline Phosphatase: 63 U/L (ref 38–126)
Anion gap: 27 — ABNORMAL HIGH (ref 5–15)
BUN: 28 mg/dL — ABNORMAL HIGH (ref 8–23)
CO2: 13 mmol/L — ABNORMAL LOW (ref 22–32)
Calcium: 5.7 mg/dL — CL (ref 8.9–10.3)
Chloride: 102 mmol/L (ref 98–111)
Creatinine, Ser: 3.21 mg/dL — ABNORMAL HIGH (ref 0.44–1.00)
GFR, Estimated: 14 mL/min — ABNORMAL LOW (ref >60)
Glucose, Bld: 152 mg/dL — ABNORMAL HIGH (ref 70–99)
Potassium: 6.4 mmol/L (ref 3.5–5.1)
Sodium: 142 mmol/L (ref 135–145)
Total Bilirubin: 1.4 mg/dL — ABNORMAL HIGH (ref 0.3–1.2)
Total Protein: 3.5 g/dL — ABNORMAL LOW (ref 6.5–8.1)

## 2020-07-11 LAB — GLUCOSE, CAPILLARY
Glucose-Capillary: 141 mg/dL — ABNORMAL HIGH (ref 70–99)
Glucose-Capillary: 120 mg/dL — ABNORMAL HIGH (ref 70–99)
Glucose-Capillary: 175 mg/dL — ABNORMAL HIGH (ref 70–99)
Glucose-Capillary: 142 mg/dL — ABNORMAL HIGH (ref 70–99)

## 2020-07-11 LAB — PROCALCITONIN: Procalcitonin: 0.94 ng/mL

## 2020-07-11 LAB — PHOSPHORUS: Phosphorus: 13 mg/dL — ABNORMAL HIGH (ref 2.5–4.6)

## 2020-07-11 LAB — BASIC METABOLIC PANEL
Anion gap: 26 — ABNORMAL HIGH (ref 5–15)
BUN: 27 mg/dL — ABNORMAL HIGH (ref 8–23)
CO2: 14 mmol/L — ABNORMAL LOW (ref 22–32)
Calcium: 6 mg/dL — CL (ref 8.9–10.3)
Chloride: 103 mmol/L (ref 98–111)
Creatinine, Ser: 2.93 mg/dL — ABNORMAL HIGH (ref 0.44–1.00)
GFR, Estimated: 16 mL/min — ABNORMAL LOW (ref >60)
Glucose, Bld: 175 mg/dL — ABNORMAL HIGH (ref 70–99)
Potassium: 6.3 mmol/L (ref 3.5–5.1)
Sodium: 143 mmol/L (ref 135–145)

## 2020-07-11 LAB — HEMOGLOBIN AND HEMATOCRIT, BLOOD
HCT: 27.8 % — ABNORMAL LOW (ref 36.0–46.0)
HCT: 25.5 % — ABNORMAL LOW (ref 36.0–46.0)
Hemoglobin: 8.7 g/dL — ABNORMAL LOW (ref 12.0–15.0)
Hemoglobin: 8.3 g/dL — ABNORMAL LOW (ref 12.0–15.0)

## 2020-07-11 LAB — MAGNESIUM: Magnesium: 2 mg/dL (ref 1.7–2.4)

## 2020-07-11 MED ORDER — DEXTROSE 50 % IV SOLN
1.0000 | Freq: Once | INTRAVENOUS | Status: AC
  Administered 2020-07-11: 50 mL via INTRAVENOUS
  Filled 2020-07-11: qty 50

## 2020-07-11 MED ORDER — SODIUM BICARBONATE 8.4 % IV SOLN
50.0000 meq | Freq: Once | INTRAVENOUS | Status: AC
  Administered 2020-07-11: 50 meq via INTRAVENOUS
  Filled 2020-07-11: qty 50

## 2020-07-11 MED ORDER — PIPERACILLIN-TAZOBACTAM IN DEX 2-0.25 GM/50ML IV SOLN
2.2500 g | Freq: Three times a day (TID) | INTRAVENOUS | Status: DC
  Filled 2020-07-11 (×3): qty 50

## 2020-07-11 MED ORDER — CALCIUM GLUCONATE-NACL 1-0.675 GM/50ML-% IV SOLN
1.0000 g | Freq: Once | INTRAVENOUS | Status: AC
  Administered 2020-07-11: 1000 mg via INTRAVENOUS
  Filled 2020-07-11: qty 50

## 2020-07-11 MED ORDER — INSULIN ASPART 100 UNIT/ML IV SOLN
10.0000 [IU] | Freq: Once | INTRAVENOUS | Status: AC
  Administered 2020-07-11: 10 [IU] via INTRAVENOUS
  Filled 2020-07-11: qty 0.1

## 2020-07-12 LAB — TYPE AND SCREEN
ABO/RH(D): A POS
Antibody Screen: NEGATIVE
Unit division: 0
Unit division: 0
Unit division: 0
Unit division: 0
Unit division: 0
Unit division: 0

## 2020-07-12 LAB — BPAM RBC
Blood Product Expiration Date: 202110112359
Blood Product Expiration Date: 202110302359
Blood Product Expiration Date: 202111012359
Blood Product Expiration Date: 202111012359
Blood Product Expiration Date: 202111012359
Blood Product Expiration Date: 202111012359
ISSUE DATE / TIME: 202110080016
ISSUE DATE / TIME: 202110080016
ISSUE DATE / TIME: 202110080619
ISSUE DATE / TIME: 202110082139
ISSUE DATE / TIME: 202110082247
Unit Type and Rh: 6200
Unit Type and Rh: 6200
Unit Type and Rh: 6200
Unit Type and Rh: 6200
Unit Type and Rh: 6200
Unit Type and Rh: 6200

## 2020-07-12 LAB — PREPARE RBC (CROSSMATCH)

## 2020-07-13 LAB — COMPREHENSIVE METABOLIC PANEL
ALT: 20 U/L (ref 0–44)
AST: 34 U/L (ref 15–41)
Albumin: 1.8 g/dL — ABNORMAL LOW (ref 3.5–5.0)
Alkaline Phosphatase: 28 U/L — ABNORMAL LOW (ref 38–126)
Anion gap: 14 (ref 5–15)
BUN: 31 mg/dL — ABNORMAL HIGH (ref 8–23)
CO2: 18 mmol/L — ABNORMAL LOW (ref 22–32)
Calcium: 6.4 mg/dL — CL (ref 8.9–10.3)
Chloride: 110 mmol/L (ref 98–111)
Creatinine, Ser: 2 mg/dL — ABNORMAL HIGH (ref 0.44–1.00)
GFR, Estimated: 25 mL/min — ABNORMAL LOW (ref >60)
Glucose, Bld: 246 mg/dL — ABNORMAL HIGH (ref 70–99)
Potassium: 5.7 mmol/L — ABNORMAL HIGH (ref 3.5–5.1)
Sodium: 142 mmol/L (ref 135–145)
Total Bilirubin: 0.7 mg/dL (ref 0.3–1.2)
Total Protein: 3.2 g/dL — ABNORMAL LOW (ref 6.5–8.1)

## 2020-07-13 LAB — CULTURE, RESPIRATORY W GRAM STAIN: Culture: NORMAL

## 2020-07-13 LAB — GLUCOSE, CAPILLARY: Glucose-Capillary: 20 mg/dL — CL (ref 70–99)

## 2020-07-15 LAB — CULTURE, BLOOD (ROUTINE X 2)
Culture: NO GROWTH
Culture: NO GROWTH

## 2020-08-03 NOTE — Progress Notes (Signed)
Subjective  - POD #1, s/p EVAR for AAA rupture  Intubated and sedated   Physical Exam:  Right leg cool without doppler signals abd full but still soft    Assessment/Plan:  POD #1  Patient's daughters at bedside Discussed poor prognosis.  Patient requiring multiple pressors for hemodynamic control Has been off sedation since 5am yesterday  Discussed poor prognosis Appreciate CCM assistance  Wells Milynn Quirion Aug 07, 2020 4:16 PM --  Vitals:   August 07, 2020 0900 08-07-2020 1430  BP: (!) 133/100 (!) 40/26  Pulse:    Resp: (!) 24 (!) 0  Temp: (!) 97.5 F (36.4 C) (!) 95.4 F (35.2 C)  SpO2:  (!) 70%    Intake/Output Summary (Last 24 hours) at Aug 07, 2020 1616 Last data filed at 2020/08/07 0309 Gross per 24 hour  Intake 5786.97 ml  Output 5 ml  Net 5781.97 ml     Laboratory CBC    Component Value Date/Time   WBC 14.2 (H) 08-07-2020 0453   HGB 8.3 (L) August 07, 2020 0830   HCT 25.5 (L) 07-Aug-2020 0830   PLT 35 (L) 08/07/2020 0453    BMET    Component Value Date/Time   NA 142 Aug 07, 2020 0453   K 6.4 (HH) 08-07-2020 0453   CL 102 08/07/20 0453   CO2 13 (L) 2020-08-07 0453   GLUCOSE 152 (H) 07-Aug-2020 0453   BUN 28 (H) 2020/08/07 0453   CREATININE 3.21 (H) Aug 07, 2020 0453   CALCIUM 5.7 (LL) 08-07-2020 0453   GFRNONAA 14 (L) 08/07/20 0453    COAG Lab Results  Component Value Date   INR 2.1 (H) 07/03/2020   INR 1.2 07/14/2020   No results found for: PTT  Antibiotics Anti-infectives (From admission, onward)   Start     Dose/Rate Route Frequency Ordered Stop   August 07, 2020 2200  piperacillin-tazobactam (ZOSYN) IVPB 2.25 g        2.25 g 100 mL/hr over 30 Minutes Intravenous Every 8 hours 08/07/20 1355     2020/08/07 0600  vancomycin (VANCOREADY) IVPB 1250 mg/250 mL  Status:  Discontinued        1,250 mg 166.7 mL/hr over 90 Minutes Intravenous Every 24 hours 07/24/2020 0600 07/06/2020 1035   07/30/2020 1800  piperacillin-tazobactam (ZOSYN) IVPB 3.375 g  Status:   Discontinued        3.375 g 100 mL/hr over 30 Minutes Intravenous Every 6 hours 07/22/2020 1337 Aug 07, 2020 1355   07/07/2020 0600  ceFAZolin (ANCEF) IVPB 1 g/50 mL premix  Status:  Discontinued        1 g 100 mL/hr over 30 Minutes Intravenous On call to O.R. 07/22/2020 2344 07/25/2020 0151   07/20/2020 0600  ceFAZolin (ANCEF) IVPB 1 g/50 mL premix  Status:  Discontinued        1 g 100 mL/hr over 30 Minutes Intravenous Every 8 hours 07/27/2020 0151 07/08/2020 0202   08/02/2020 0600  piperacillin-tazobactam (ZOSYN) IVPB 3.375 g  Status:  Discontinued        3.375 g 12.5 mL/hr over 240 Minutes Intravenous Every 8 hours 07/25/2020 0546 07/09/2020 1337   07/04/2020 0600  vancomycin (VANCOREADY) IVPB 2000 mg/400 mL        2,000 mg 200 mL/hr over 120 Minutes Intravenous  Once 07/23/2020 0547 07/09/2020 1731   07/10/20 0000  ceFAZolin (ANCEF) IVPB 1 g/50 mL premix  Status:  Discontinued       Note to Pharmacy: To be given in specials   1 g 100 mL/hr over 30 Minutes Intravenous  Once  07/24/2020 2325 07/17/2020 0152       V. Leia Alf, M.D., [facility] [place] Office: [phone] Pager:  [phone]

## 2020-08-03 NOTE — Progress Notes (Addendum)
1430 Patient now in PEA. No peripheral pulses noted or dopplered.  Ulysses Patient died. Pronounced per Dr. Mortimer Fries. Family with patient when she died.

## 2020-08-03 NOTE — Progress Notes (Signed)
[facility]  ROUNDING NOTE   Subjective:   Overnight events noted. Patient did not tolerate CRRT.   Husband at bedside.   Objective:  Vital signs in last 24 hours:  Temp:  [77.5 F (25.3 C)-100.2 F (37.9 C)] 97.5 F (36.4 C) (10/09 0900) Pulse Rate:  [99-141] 99 (10/09 0000) Resp:  [9-26] 24 (10/09 0900) BP: (48-139)/(20-103) 133/100 (10/09 0900) SpO2:  [98 %-100 %] 100 % (10/09 0000) FiO2 (%):  [40 %-100 %] 100 % (10/09 0312)  Weight change:  Filed Weights   07/24/2020 1936  Weight: 83.9 kg    Intake/Output: I/O last 3 completed shifts: In: 15228.7 [I.V.:12007.4; Blood:1076; IV Piggyback:2145.3] Out: 490 [Urine:490]   Intake/Output this shift:  No intake/output data recorded.  Physical Exam: General: Critically ill   Head: ETT  Eyes: Pupils not reactive to light  Neck: trachea midline  Lungs:  PRVC Fio2 100%  Heart: Regular rate and rhythm  Abdomen:  +distended  Extremities: + peripheral edema.  Neurologic: Off sedation, no response to pain  Skin: No lesions  Access: Temp HD catheter 16/6    Basic Metabolic Panel: Recent Labs  Lab 08/01/2020 0450 08/02/2020 0450 07/17/2020 0831 07/07/2020 0831 07/24/2020 1552 08/08/20 0101 08/08/20 0453  NA 142  --  143  --  147*  146* 143 142  K 5.7*  --  6.5*  --  4.2  4.2 6.3* 6.4*  CL 110  --  107  --  106  105 103 102  CO2 18*  --  23  --  24  24 14* 13*  GLUCOSE 246*  --  180*  --  60*  58* 175* 152*  BUN 31*  --  33*  --  29*  27* 27* 28*  CREATININE 2.00*  --  2.28*  --  2.48*  2.34* 2.93* 3.21*  CALCIUM 6.4*   < > 6.2*   < > 6.1*  6.1* 6.0* 5.7*  MG 1.9  --   --   --   --   --  2.0  PHOS  --   --   --   --  8.2*  --  13.0*   < > = values in this interval not displayed.    Liver Function Tests: Recent Labs  Lab 07/03/2020 2024 07/15/2020 0450 07/31/2020 0831 07/24/2020 1552 08-Aug-2020 0453  AST 28 34 121*  --  >10,000*  ALT 19 20 109*  --  12,415*  ALKPHOS 46 28* 36*  --  63  BILITOT 0.8 0.7  1.1  --  1.4*  PROT 7.0 3.2* 3.6*  --  3.5*  ALBUMIN 3.8 1.8* 2.0* 1.9* 1.6*   Recent Labs  Lab 07/23/2020 2024  LIPASE 40   No results for input(s): AMMONIA in the last 168 hours.  CBC: Recent Labs  Lab 08/02/2020 2024 07/29/2020 2024 07/14/2020 0450 07/17/2020 0450 07/23/2020 0826 07/21/2020 0826 07/05/2020 1348 07/17/2020 2007 08/08/20 0101 08-08-2020 0453 08/08/20 0830  WBC 10.2  --  25.2*  --  24.9*  --   --   --   --  14.2*  --   NEUTROABS  --   --  20.2*  --  20.5*  --   --   --   --  10.6*  --   HGB 9.7*   < > 7.1*   < > 8.5*   < > 7.4* 5.0* 8.7* 9.3* 8.3*  HCT 31.4*   < > 22.6*   < > 25.6*   < >  21.6* 16.2* 27.8* 28.1* 25.5*  MCV 90.8  --  91.5  --  90.8  --   --   --   --  94.9  --   PLT 215  --  96*  --  86*  --   --   --   --  35*  --    < > = values in this interval not displayed.    Cardiac Enzymes: No results for input(s): CKTOTAL, CKMB, CKMBINDEX, TROPONINI in the last 168 hours.  BNP: Invalid input(s): POCBNP  CBG: Recent Labs  Lab 07/05/2020 1953 07/12/2020 2355 2020-08-10 0138 08/10/20 0439 08/10/20 0825  GLUCAP 154* 48* 141* 120* 175*    Microbiology: Results for orders placed or performed during the hospital encounter of 07/04/2020  Resp Panel by RT PCR (RSV, Flu A&B, Covid) -     Status: None   Collection Time: 07/30/2020 11:04 PM  Result Value Ref Range Status   SARS Coronavirus 2 by RT PCR NEGATIVE NEGATIVE Final   Influenza A by PCR NEGATIVE NEGATIVE Final   Influenza B by PCR NEGATIVE NEGATIVE Final   Respiratory Syncytial Virus by PCR NEGATIVE NEGATIVE Final    Comment: Performed at [facility], [address] [place], [place] [address]  MRSA PCR Screening     Status: None   Collection Time: 07/16/2020  2:19 AM   Specimen: Nasopharyngeal  Result Value Ref Range Status   MRSA by PCR NEGATIVE NEGATIVE Final    Comment:        The GeneXpert MRSA Assay (FDA approved for NASAL specimens only), is one component of a comprehensive MRSA  colonization surveillance program. It is not intended to diagnose MRSA infection nor to guide or monitor treatment for MRSA infections. Performed at [facility], [address] [place], [place] [address]   CULTURE, BLOOD (ROUTINE X 2) w Reflex to ID Panel     Status: None (Preliminary result)   Collection Time: 08/02/2020  3:42 AM   Specimen: BLOOD  Result Value Ref Range Status   Specimen Description BLOOD RIGHT HAND  Final   Special Requests   Final    BOTTLES DRAWN AEROBIC ONLY Blood Culture results may not be optimal due to an inadequate volume of blood received in culture bottles   Culture   Final    NO GROWTH <12 HOURS Performed at [facility], [address] [place], [place] [address]    Report Status PENDING  Incomplete  Culture, respiratory     Status: None (Preliminary result)   Collection Time: 08/01/2020  8:41 AM   Specimen: Tracheal Aspirate; Respiratory  Result Value Ref Range Status   Specimen Description   Final    TRACHEAL ASPIRATE Performed at [facility], [address], [place], [place] [address]    Special Requests   Final    NONE Performed at [facility], [address] [place], [place] [address]    Gram Stain   Final    RARE WBC PRESENT,BOTH PMN AND MONONUCLEAR FEW GRAM POSITIVE COCCI IN PAIRS IN CLUSTERS Performed at [facility], [place] [address] [place], [place] [address]    Culture PENDING  Incomplete   Report Status PENDING  Incomplete    Coagulation Studies: Recent Labs    07/18/2020 2327 07/26/2020 0826  LABPROT 14.8 23.1*  INR 1.2 2.1*    Urinalysis: Recent Labs    07/14/2020 2024  COLORURINE AMBER*  LABSPEC 1.023  PHURINE 5.0  GLUCOSEU NEGATIVE  HGBUR NEGATIVE  BILIRUBINUR NEGATIVE  KETONESUR NEGATIVE  PROTEINUR 30*  NITRITE NEGATIVE  LEUKOCYTESUR NEGATIVE      Imaging: CT ABDOMEN PELVIS WO CONTRAST  Result Date: 07/08/2020 CLINICAL DATA:  Nausea, vomiting EXAM: CT ABDOMEN  AND PELVIS WITHOUT CONTRAST TECHNIQUE: Multidetector CT imaging of the abdomen and pelvis was performed following the standard protocol without IV contrast. COMPARISON:  None. FINDINGS: Lower chest: There is asymmetric ground-glass pulmonary infiltrate within the right lung base, nonspecific, possibly infectious or inflammatory in nature. Superimposed minimal subpleural pulmonary fibrosis. The visualized heart and pericardium are unremarkable. Hepatobiliary: The liver is unremarkable. No intra or extrahepatic biliary ductal dilation. Layering high density material within the gallbladder lumen likely represent small gallstones or hyperdense sludge. Pancreas: Unremarkable Spleen: Unremarkable Adrenals/Urinary Tract: There is extensive right perinephric stranding likely representing infiltration related to the retroperitoneal hemorrhage described more fully below. This obscures the right adrenal gland. Left adrenal gland is unremarkable. Kidneys are otherwise unremarkable. Bladder is unremarkable. Stomach/Bowel: The stomach, small bowel, and large bowel are unremarkable. The appendix is not clearly identified and is likely absent. Small free fluid is seen within the pelvis, nonspecific. No free intraperitoneal gas. Vascular/Lymphatic: A fusiform infrarenal abdominal aortic aneurysm is present measuring 7.3 x 7.5 x 9.5 cm in greatest dimension roughly 55 degree posterior angulation at the proximal neck and roughly 85 degree angulation between the aneurysm sac and the right common iliac artery. There is a large amount of hyperdense material seen anteriorly and right lateral to the aneurysm sac in keeping with acute hemorrhage within the retroperitoneum secondary to aneurysm rupture. Infiltrating hemorrhagic material extends into the right anterior and posterior pararenal space and subsequently into the right hemipelvis. 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 Extensive superimposed  atherosclerotic calcification within the lower extremity arterial inflow. No pathologic adenopathy within the abdomen and pelvis. Reproductive: Uterus and bilateral adnexa are unremarkable. Other: Rectum unremarkable. Musculoskeletal: No acute bone abnormality. IMPRESSION: Ruptured 7.5 cm infrarenal abdominal aortic aneurysm with extensive retroperitoneal hemorrhage. Significant angulation at the juncture of the aneurysm and right common iliac artery. Moderate angulation involving the proximal neck and body of the aneurysm. These results were called by telephone at the time of interpretation on 07/13/2020 at 10:28 pm to provider [facility] , who verbally acknowledged these results. Electronically Signed   By: Fidela Salisbury MD   On: 07/12/2020 22:30   PERIPHERAL VASCULAR CATHETERIZATION  Result Date: 07/03/2020 See op note  DG Chest Port 1 View  Result Date: 07/23/2020 CLINICAL DATA:  Dialysis catheter placement. EXAM: PORTABLE CHEST 1 VIEW COMPARISON:  Same day. FINDINGS: Stable cardiomediastinal silhouette. Endotracheal and nasogastric tubes are unchanged in position. Interval placement of right internal jugular catheter with distal tip in the right atrium. Left internal jugular catheter is unchanged. No pneumothorax is noted. Minimal bibasilar subsegmental atelectasis is noted. No pleural effusion is noted. Bony thorax is unremarkable. IMPRESSION: Interval placement of right internal jugular catheter with distal tip in right atrium. No pneumothorax is noted. Minimal bibasilar subsegmental atelectasis. Electronically Signed   By: Marijo Conception M.D.   On: 07/08/2020 12:27   DG Chest Port 1 View  Result Date: 07/22/2020 CLINICAL DATA:  Central line placement EXAM: PORTABLE CHEST 1 VIEW COMPARISON:  Earlier today FINDINGS: Left IJ line with tip at the brachiocephalic SVC junction. No new mediastinal widening or pneumothorax. The enteric tube tip is at the stomach. Endotracheal tube tip is at the  clavicular heads.  Stable low volume chest with mild atelectatic type density. Stable heart size with aortic tortuosity accentuated by rotation. IMPRESSION: 1. New central line with tip near the SVC brachiocephalic junction. No pneumothorax. 2. Otherwise stable. Electronically Signed   By: Monte Fantasia M.D.   On: 07/21/2020 05:05   Portable Chest x-ray  Result Date: 07/05/2020 CLINICAL DATA:  Post intubation.  OG tube placement. EXAM: PORTABLE CHEST 1 VIEW COMPARISON:  October 27, 2014 FINDINGS: The endotracheal tube terminates above the carina. The enteric tube appears to terminate over the gastric body. The heart size is borderline enlarged. There are hazy bilateral airspace opacities, greatest in the right lower lobe. Aortic calcifications are noted. The mediastinum appears somewhat widened which may be projectional are due to low lung volumes. IMPRESSION: 1. Endotracheal tube terminates above the carina. 2. Enteric tube terminates over the gastric body. 3. Hazy bilateral airspace opacities, greatest in the right lower lobe, may represent atelectasis or infiltrates. Electronically Signed   By: Constance Holster M.D.   On: 07/24/2020 02:51   ECHOCARDIOGRAM COMPLETE  Result Date: 07/21/2020    ECHOCARDIOGRAM REPORT   Patient Name:   Isabel Castro Date of Exam: 07/03/2020 Medical Rec #:  [id number]           Height:       63.0 in Accession #:    [id number]          Weight:       185.0 lb Date of Birth:  Sep 16, 1951           BSA:          1.871 m Patient Age:    23 years            BP:           106/82 mmHg Patient Gender: F                   HR:           137 bpm. Exam Location:  [facility] Procedure: 2D Echo, Color Doppler and Cardiac Doppler Indications:     Shock  History:         Patient has no prior history of Echocardiogram examinations.                  Risk Factors:Dyslipidemia.  Sonographer:     Charmayne Sheer RDCS (AE) Referring Phys:  [id number] Bradly Bienenstock Diagnosing Phys: Ida Rogue MD   Sonographer Comments: Echo performed with patient supine and on artificial respirator and no subcostal window. Image acquisition challenging due to patient body habitus. IMPRESSIONS  1. Left ventricular ejection fraction, by estimation, is 60 to 65%. The left ventricle has normal function. The left ventricle has no regional wall motion abnormalities. There is severe left ventricular hypertrophy with near cavity obliteration in systole. Left ventricular diastolic parameters are consistent with Grade I diastolic dysfunction (impaired relaxation).  2. Right ventricular systolic function is normal. The right ventricular size is normal. Tricuspid regurgitation signal is inadequate for assessing PA pressure.  3. Sinus tachycardia noted, rate 138 bpm  4. Challenging images FINDINGS  Left Ventricle: Left ventricular ejection fraction, by estimation, is 60 to 65%. The left ventricle has normal function. The left ventricle has no regional wall motion abnormalities. The left ventricular internal cavity size was normal in size. There is  severe concentric left ventricular hypertrophy. Left ventricular diastolic parameters are consistent with Grade I diastolic dysfunction (impaired relaxation). Right Ventricle: The right ventricular size  is normal. No increase in right ventricular wall thickness. Right ventricular systolic function is normal. Tricuspid regurgitation signal is inadequate for assessing PA pressure. Left Atrium: Left atrial size was normal in size. Right Atrium: Right atrial size was normal in size. Pericardium: There is no evidence of pericardial effusion. Mitral Valve: The mitral valve is normal in structure. No evidence of mitral valve regurgitation. No evidence of mitral valve stenosis. MV peak gradient, 5.2 mmHg. The mean mitral valve gradient is 2.0 mmHg. Tricuspid Valve: The tricuspid valve is normal in structure. Tricuspid valve regurgitation is not demonstrated. No evidence of tricuspid stenosis. Aortic  Valve: The aortic valve was not well visualized. Aortic valve regurgitation is not visualized. No aortic stenosis is present. Aortic valve mean gradient measures 5.0 mmHg. Aortic valve peak gradient measures 11.0 mmHg. Aortic valve area, by VTI measures 1.94 cm. Pulmonic Valve: The pulmonic valve was normal in structure. Pulmonic valve regurgitation is not visualized. No evidence of pulmonic stenosis. Aorta: The aortic root is normal in size and structure. Venous: The inferior vena cava is normal in size with greater than 50% respiratory variability, suggesting right atrial pressure of 3 mmHg. IAS/Shunts: No atrial level shunt detected by color flow Doppler.  LEFT VENTRICLE PLAX 2D LVIDd:         3.60 cm  Diastology LVIDs:         2.94 cm  LV e' medial:    5.44 cm/s LV PW:         1.00 cm  LV E/e' medial:  9.7 LV IVS:        1.25 cm  LV e' lateral:   6.20 cm/s LVOT diam:     2.20 cm  LV E/e' lateral: 8.5 LV SV:         30 LV SV Index:   16 LVOT Area:     3.80 cm  LEFT ATRIUM             Index LA diam:        3.30 cm 1.76 cm/m LA Vol (A2C):   17.5 ml 9.36 ml/m LA Vol (A4C):   25.6 ml 13.69 ml/m LA Biplane Vol: 22.9 ml 12.24 ml/m  AORTIC VALVE                    PULMONIC VALVE AV Area (Vmax):    2.68 cm     PV Vmax:       1.72 m/s AV Area (Vmean):   2.59 cm     PV Vmean:      114.000 cm/s AV Area (VTI):     1.94 cm     PV VTI:        0.179 m AV Vmax:           166.00 cm/s  PV Peak grad:  11.8 mmHg AV Vmean:          103.000 cm/s PV Mean grad:  6.0 mmHg AV VTI:            0.155 m AV Peak Grad:      11.0 mmHg AV Mean Grad:      5.0 mmHg LVOT Vmax:         117.00 cm/s LVOT Vmean:        70.100 cm/s LVOT VTI:          0.079 m LVOT/AV VTI ratio: 0.51  AORTA Ao Root diam: 2.80 cm MITRAL VALVE MV Area (PHT): 8.43 cm    SHUNTS MV  Peak grad:  5.2 mmHg    Systemic VTI:  0.08 m MV Mean grad:  2.0 mmHg    Systemic Diam: 2.20 cm MV Vmax:       1.14 m/s MV Vmean:      66.4 cm/s MV Decel Time: 90 msec MV E velocity: 52.60  cm/s MV A velocity: 89.40 cm/s MV E/A ratio:  0.59 Ida Rogue MD Electronically signed by Ida Rogue MD Signature Date/Time: 07/18/2020/3:23:52 PM    Final      Medications:   . sodium chloride    . sodium chloride 20 mL/hr at 07/19/20 0309  . sodium chloride 100 mL/hr at 07-19-20 0309  . amiodarone 30 mg/hr (07/19/2020 0450)  . dexmedetomidine (PRECEDEX) IV infusion Stopped (07/17/2020 1804)  . dextrose 40 mL/hr at 07/19/20 0309  . famotidine (PEPCID) IV Stopped (07/18/2020 2249)  . fentaNYL infusion INTRAVENOUS Stopped (08/02/2020 2343)  . norepinephrine (LEVOPHED) Adult infusion 40 mcg/min (07/19/2020 0654)  . phenylephrine (NEO-SYNEPHRINE) Adult infusion 400 mcg/min (07-19-2020 0800)  . piperacillin-tazobactam 3.375 g (07/19/2020 0544)  . prismasol BGK 2/2.5 dialysis solution 2,000 mL/hr at 07/27/2020 1724  . prismasol BGK 2/2.5 replacement solution 500 mL/hr at 07/26/2020 1425  . prismasol BGK 2/2.5 replacement solution 500 mL/hr at 07/23/2020 1425  .  sodium bicarbonate (isotonic) infusion in sterile water 125 mL/hr at 07-19-20 0559  . vasopressin 0.03 Units/min (2020/07/19 0928)   . chlorhexidine gluconate (MEDLINE KIT)  15 mL Mouth Rinse BID  . Chlorhexidine Gluconate Cloth  6 each Topical Daily  . docusate  100 mg Oral BID  . fentaNYL (SUBLIMAZE) injection  25 mcg Intravenous Once  . hydrocortisone sod succinate (SOLU-CORTEF) inj  50 mg Intravenous Q6H  . insulin aspart  0-15 Units Subcutaneous Q4H  . mouth rinse  15 mL Mouth Rinse 10 times per day  . polyethylene glycol  17 g Oral Daily   fentaNYL, heparin, ipratropium-albuterol, midazolam, midazolam  Assessment/ Plan:  Ms. Isabel Castro is a 69 y.o.black  female with hypertension, hyperlipidemia, prediabetes, peripheral vascular disease who was admitted to [facility] on 07/27/2020 for Ruptured abdominal aortic aneurysm (AAA) ([place]) [I71.3] AAA (abdominal aortic aneurysm) ([place]) [I71.4]  1. Acute Kidney injury with metabolic acidosis  and hyperkalemia: anuric. Baseline creatinine of who was admitted with GFR >60 on 05/25/20.  Did not tolerate renal replacement therapy. Currently not hemodynamically stable for continuous renal replacement therapy.  - bicarb infusion.  - discontinue NS infusion.   2. Hemorrhagic shock with hypotension: requiring multiple vasopressors. Phenylephrine, norepinephrine, and vasopressin.   3. Acute blood loss anemia with anemia of renal failure: secondary to AAA rupture Status post PRBC transfusion. However hemoglobin starting to trend down.   Overall prognosis is poor. Discussed case with patient's husband.   LOS: 1 Elgar Scoggins 2021-10-179:35 AM

## 2020-08-03 NOTE — Progress Notes (Addendum)
CRITICAL CARE NOTE  ACUTE HEMORRHAGIC SHOCK WITH RUPTURED AAA WITH MULTIORGAN FAILURE PATIENT IN DYING PROCESS  10/7 admitted for ruptured AAA LEFT IJ placed 10/8 acute cardiac arrest, RT VASC cath placed, did not tolerate CRRT 10/9 patient made DNR, multiple pressors, severe hypoxia   CC  follow up respiratory failure  SUBJECTIVE Patient remains critically ill Prognosis is guarded Patient is dying Cardiac arrest last night, family called to bedside Patient was made DNR status   Vent Mode: PRVC FiO2 (%):  [40 %-100 %] 100 % Set Rate:  [24 bmp] 24 bmp Vt Set:  [400 mL-500 mL] 500 mL PEEP:  [5 cmH20] 5 cmH20  CBC    Component Value Date/Time   WBC 14.2 (H) 2020/07/27 0453   RBC 2.96 (L) 27-Jul-2020 0453   HGB 9.3 (L) 07-27-2020 0453   HCT 28.1 (L) 07-27-20 0453   PLT 35 (L) 07-27-20 0453   MCV 94.9 27-Jul-2020 0453   MCH 31.4 Jul 27, 2020 0453   MCHC 33.1 2020-07-27 0453   RDW 16.4 (H) 07-27-20 0453   LYMPHSABS 0.8 07-27-2020 0453   MONOABS 0.5 Jul 27, 2020 0453   EOSABS 2.0 (H) 07/27/2020 0453   BASOSABS 0.1 2020/07/27 0453   BMP Latest Ref Rng & Units Jul 27, 2020 07/16/2020 07/06/2020  Glucose 70 - 99 mg/dL 175(H) 60(L) 58(L)  BUN 8 - 23 mg/dL 27(H) 29(H) 27(H)  Creatinine 0.44 - 1.00 mg/dL 2.93(H) 2.48(H) 2.34(H)  Sodium 135 - 145 mmol/L 143 147(H) 146(H)  Potassium 3.5 - 5.1 mmol/L 6.3(HH) 4.2 4.2  Chloride 98 - 111 mmol/L 103 106 105  CO2 22 - 32 mmol/L 14(L) 24 24  Calcium 8.9 - 10.3 mg/dL 6.0(LL) 6.1(LL) 6.1(LL)     BP (!) 111/55   Pulse 99   Temp 97.7 F (36.5 C) (Esophageal)   Resp (!) 24   Ht 5' 2.99" (1.6 m)   Wt 83.9 kg   SpO2 100%   BMI 32.78 kg/m    I/O last 3 completed shifts: In: 15228.7 [I.V.:12007.4; Blood:1076; IV Piggyback:2145.3] Out: 490 [Urine:490] No intake/output data recorded.  SpO2: 100 % FiO2 (%): 100 %  Estimated body mass index is 32.78 kg/m as calculated from the following:   Height as of this encounter: 5' 2.99"  (1.6 m).   Weight as of this encounter: 83.9 kg.  SIGNIFICANT EVENTS   REVIEW OF SYSTEMS  PATIENT IS UNABLE TO PROVIDE COMPLETE REVIEW OF SYSTEMS DUE TO SEVERE CRITICAL ILLNESS        PHYSICAL EXAMINATION:  GENERAL:critically ill appearing, +resp distress HEAD: Normocephalic, atraumatic.  EYES: pupils fixed and dilated MOUTH: Moist mucosal membrane. NECK: Supple.  PULMONARY: +rhonchi, +wheezing CARDIOVASCULAR: S1 and S2. Regular rate and rhythm. No murmurs, rubs, or gallops.  GASTROINTESTINAL: Soft, nontender, -distended.  Positive bowel sounds.   MUSCULOSKELETAL: No swelling, clubbing, or edema.  NEUROLOGIC: obtunded, GCS<8 SKIN:intact,warm,dry  MEDICATIONS: I have reviewed all medications and confirmed regimen as documented   CULTURE RESULTS   Recent Results (from the past 240 hour(s))  Resp Panel by RT PCR (RSV, Flu A&B, Covid) -     Status: None   Collection Time: 07/04/2020 11:04 PM  Result Value Ref Range Status   SARS Coronavirus 2 by RT PCR NEGATIVE NEGATIVE Final   Influenza A by PCR NEGATIVE NEGATIVE Final   Influenza B by PCR NEGATIVE NEGATIVE Final   Respiratory Syncytial Virus by PCR NEGATIVE NEGATIVE Final    Comment: Performed at [facility], [address], [place], [place] [address]  MRSA PCR Screening     Status: None   Collection Time: 07/22/2020  2:19 AM   Specimen: Nasopharyngeal  Result Value Ref Range Status   MRSA by PCR NEGATIVE NEGATIVE Final    Comment:        The GeneXpert MRSA Assay (FDA approved for NASAL specimens only), is one component of a comprehensive MRSA colonization surveillance program. It is not intended to diagnose MRSA infection nor to guide or monitor treatment for MRSA infections. Performed at [facility], [address] [place], [place] [address]   CULTURE, BLOOD (ROUTINE X 2) w Reflex to ID Panel     Status: None (Preliminary result)   Collection Time: 07/19/2020  3:42 AM   Specimen: BLOOD   Result Value Ref Range Status   Specimen Description BLOOD RIGHT HAND  Final   Special Requests   Final    BOTTLES DRAWN AEROBIC ONLY Blood Culture results may not be optimal due to an inadequate volume of blood received in culture bottles   Culture   Final    NO GROWTH <12 HOURS Performed at [facility], [address] [place], [place] [address]    Report Status PENDING  Incomplete  Culture, respiratory     Status: None (Preliminary result)   Collection Time: 07/27/2020  8:41 AM   Specimen: Tracheal Aspirate; Respiratory  Result Value Ref Range Status   Specimen Description   Final    TRACHEAL ASPIRATE Performed at [facility], [address] [place], [place] [address]    Special Requests   Final    NONE Performed at [facility], [address] [place], [place] [address]    Gram Stain   Final    RARE WBC PRESENT,BOTH PMN AND MONONUCLEAR FEW GRAM POSITIVE COCCI IN PAIRS IN CLUSTERS Performed at [facility], [place] [address], [place], [place] [address]    Culture PENDING  Incomplete   Report Status PENDING  Incomplete          IMAGING    DG Chest Port 1 View  Result Date: 07/22/2020 CLINICAL DATA:  Dialysis catheter placement. EXAM: PORTABLE CHEST 1 VIEW COMPARISON:  Same day. FINDINGS: Stable cardiomediastinal silhouette. Endotracheal and nasogastric tubes are unchanged in position. Interval placement of right internal jugular catheter with distal tip in the right atrium. Left internal jugular catheter is unchanged. No pneumothorax is noted. Minimal bibasilar subsegmental atelectasis is noted. No pleural effusion is noted. Bony thorax is unremarkable. IMPRESSION: Interval placement of right internal jugular catheter with distal tip in right atrium. No pneumothorax is noted. Minimal bibasilar subsegmental atelectasis. Electronically Signed   By: Marijo Conception M.D.   On: 07/04/2020 12:27   ECHOCARDIOGRAM COMPLETE  Result Date:  08/02/2020    ECHOCARDIOGRAM REPORT   Patient Name:   Isabel Castro Date of Exam: 07/25/2020 Medical Rec #:  [id number]           Height:       63.0 in Accession #:    [id number]          Weight:       185.0 lb Date of Birth:  September 21, 1951           BSA:          1.871 m Patient Age:    69 years            BP:           106/82 mmHg Patient Gender: F  HR:           137 bpm. Exam Location:  [facility] Procedure: 2D Echo, Color Doppler and Cardiac Doppler Indications:     Shock  History:         Patient has no prior history of Echocardiogram examinations.                  Risk Factors:Dyslipidemia.  Sonographer:     Charmayne Sheer RDCS (AE) Referring Phys:  [id number] Bradly Bienenstock Diagnosing Phys: Ida Rogue MD  Sonographer Comments: Echo performed with patient supine and on artificial respirator and no subcostal window. Image acquisition challenging due to patient body habitus. IMPRESSIONS  1. Left ventricular ejection fraction, by estimation, is 60 to 65%. The left ventricle has normal function. The left ventricle has no regional wall motion abnormalities. There is severe left ventricular hypertrophy with near cavity obliteration in systole. Left ventricular diastolic parameters are consistent with Grade I diastolic dysfunction (impaired relaxation).  2. Right ventricular systolic function is normal. The right ventricular size is normal. Tricuspid regurgitation signal is inadequate for assessing PA pressure.  3. Sinus tachycardia noted, rate 138 bpm  4. Challenging images FINDINGS  Left Ventricle: Left ventricular ejection fraction, by estimation, is 60 to 65%. The left ventricle has normal function. The left ventricle has no regional wall motion abnormalities. The left ventricular internal cavity size was normal in size. There is  severe concentric left ventricular hypertrophy. Left ventricular diastolic parameters are consistent with Grade I diastolic dysfunction (impaired relaxation). Right  Ventricle: The right ventricular size is normal. No increase in right ventricular wall thickness. Right ventricular systolic function is normal. Tricuspid regurgitation signal is inadequate for assessing PA pressure. Left Atrium: Left atrial size was normal in size. Right Atrium: Right atrial size was normal in size. Pericardium: There is no evidence of pericardial effusion. Mitral Valve: The mitral valve is normal in structure. No evidence of mitral valve regurgitation. No evidence of mitral valve stenosis. MV peak gradient, 5.2 mmHg. The mean mitral valve gradient is 2.0 mmHg. Tricuspid Valve: The tricuspid valve is normal in structure. Tricuspid valve regurgitation is not demonstrated. No evidence of tricuspid stenosis. Aortic Valve: The aortic valve was not well visualized. Aortic valve regurgitation is not visualized. No aortic stenosis is present. Aortic valve mean gradient measures 5.0 mmHg. Aortic valve peak gradient measures 11.0 mmHg. Aortic valve area, by VTI measures 1.94 cm. Pulmonic Valve: The pulmonic valve was normal in structure. Pulmonic valve regurgitation is not visualized. No evidence of pulmonic stenosis. Aorta: The aortic root is normal in size and structure. Venous: The inferior vena cava is normal in size with greater than 50% respiratory variability, suggesting right atrial pressure of 3 mmHg. IAS/Shunts: No atrial level shunt detected by color flow Doppler.  LEFT VENTRICLE PLAX 2D LVIDd:         3.60 cm  Diastology LVIDs:         2.94 cm  LV e' medial:    5.44 cm/s LV PW:         1.00 cm  LV E/e' medial:  9.7 LV IVS:        1.25 cm  LV e' lateral:   6.20 cm/s LVOT diam:     2.20 cm  LV E/e' lateral: 8.5 LV SV:         30 LV SV Index:   16 LVOT Area:     3.80 cm  LEFT ATRIUM  Index LA diam:        3.30 cm 1.76 cm/m LA Vol (A2C):   17.5 ml 9.36 ml/m LA Vol (A4C):   25.6 ml 13.69 ml/m LA Biplane Vol: 22.9 ml 12.24 ml/m  AORTIC VALVE                    PULMONIC VALVE AV Area  (Vmax):    2.68 cm     PV Vmax:       1.72 m/s AV Area (Vmean):   2.59 cm     PV Vmean:      114.000 cm/s AV Area (VTI):     1.94 cm     PV VTI:        0.179 m AV Vmax:           166.00 cm/s  PV Peak grad:  11.8 mmHg AV Vmean:          103.000 cm/s PV Mean grad:  6.0 mmHg AV VTI:            0.155 m AV Peak Grad:      11.0 mmHg AV Mean Grad:      5.0 mmHg LVOT Vmax:         117.00 cm/s LVOT Vmean:        70.100 cm/s LVOT VTI:          0.079 m LVOT/AV VTI ratio: 0.51  AORTA Ao Root diam: 2.80 cm MITRAL VALVE MV Area (PHT): 8.43 cm    SHUNTS MV Peak grad:  5.2 mmHg    Systemic VTI:  0.08 m MV Mean grad:  2.0 mmHg    Systemic Diam: 2.20 cm MV Vmax:       1.14 m/s MV Vmean:      66.4 cm/s MV Decel Time: 90 msec MV E velocity: 52.60 cm/s MV A velocity: 89.40 cm/s MV E/A ratio:  0.59 Ida Rogue MD Electronically signed by Ida Rogue MD Signature Date/Time: 07/25/2020/3:23:52 PM    Final      Nutrition Status: Nutrition Problem: Inadequate oral intake Etiology: inability to eat Signs/Symptoms: NPO status Interventions: Tube feeding, Prostat, MVI     Indwelling Urinary Catheter continued, requirement due to   Reason to continue Indwelling Urinary Catheter strict Intake/Output monitoring for hemodynamic instability   Central Line/ continued, requirement due to  Reason to continue [place] of central venous pressure or other hemodynamic parameters and poor IV access   Ventilator continued, requirement due to severe respiratory failure   Ventilator Sedation RASS 0 to -2      ASSESSMENT AND PLAN SYNOPSIS  ACUTE HEMORRHAGIC SHOCK WITH RUPTURED AAA WITH MULTIORGAN FAILURE PATIENT IN DYING PROCESS   Severe ACUTE Hypoxic and Hypercapnic Respiratory Failure -continue Full MV support -continue Bronchodilator Therapy -Wean Fio2 and PEEP as tolerated VAP/VENT bundle implementation  ACUTE CARDIAC ARREST AND SYSTOLIC FAILURE-   Morbid obesity, possible OSA.   Will  certainly impact respiratory mechanics, ventilator weaning Suspect will need to consider additional PEEP,   ACUTE KIDNEY INJURY/Renal Failure -continue Foley Catheter-assess need -Avoid nephrotoxic agents -Follow urine output, BMP -Ensure adequate renal perfusion, optimize oxygenation -Renal dose medications CRRT as tolerated     NEUROLOGY Acute toxic metabolic encephalopathy, need for sedation Goal RASS -2 to -3  SHOCK-SEPSIS/HYPOVOLUMIC/CARDIOGENIC -use vasopressors to keep MAP>65 -follow ABG and LA -follow up cultures -emperic ABX -consider stress dose steroids   CARDIAC ICU monitoring  ID -continue IV abx as prescibed -follow up cultures  GI GI PROPHYLAXIS as indicated  NUTRITIONAL STATUS Nutrition Status: Nutrition Problem: Inadequate oral intake Etiology: inability to eat Signs/Symptoms: NPO status Interventions: Tube feeding, Prostat, MVI    ENDO - will use ICU hypoglycemic\Hyperglycemia protocol if indicated     ELECTROLYTES -follow labs as needed -replace as needed -pharmacy consultation and following   DVT/GI PRX ordered and assessed TRANSFUSIONS AS NEEDED MONITOR FSBS I Assessed the need for Labs I Assessed the need for Foley I Assessed the need for Central Venous Line Family Discussion when available I Assessed the need for Mobilization I made an Assessment of medications to be adjusted accordingly Safety Risk assessment completed   CASE DISCUSSED IN MULTIDISCIPLINARY ROUNDS WITH ICU TEAM  Critical Care Time devoted to patient care services described in this note is 73  minutes.   Overall, patient is critically ill, prognosis is guarded.  Patient with Multiorgan failure and at high risk for cardiac arrest and death.   Patient is dying, patient will not recover from this medical condition, estimated mortality is 100%  Corrin Parker, M.D.  [facility]  Medical Director [place] Director [facility] Cardio-Pulmonary Department

## 2020-08-03 NOTE — Progress Notes (Signed)
PHARMACY NOTE:  ANTIMICROBIAL RENAL DOSAGE ADJUSTMENT  Current antimicrobial regimen includes a mismatch between antimicrobial dosage and estimated renal function.  As per policy approved by the Pharmacy & Therapeutics and Medical Executive Committees, the antimicrobial dosage will be adjusted accordingly.  Current antimicrobial dosage:  Zosyn 3.375 g IV q6h  Indication: intra-abdominal infection  Renal Function:  Estimated Creatinine Clearance: 17.2 mL/min (A) (by C-G formula based on SCr of 3.21 mg/dL (H)). []      On intermittent HD, scheduled: [x]      On CRRT   **CRRT off 10/9**   Antimicrobial dosage has been changed to:  Zosyn 2.25 g IV q8h    Thank you for allowing pharmacy to be a part of this patient's care.  Dorena Bodo, PharmD Jul 20, 2020 1:55 PM

## 2020-08-03 NOTE — Death Summary Note (Addendum)
DEATH SUMMARY   Patient Details  Name: Isabel Castro MRN: [id number] DOB: 1951-06-29  Admission/Discharge Information   Admit Date:  07-20-20  Date of Death:   07/22/2020   Time of Death:  1444-01-04  Length of Stay: 1  Referring Physician: Zeb Comfort, MD   Reason(s) for Hospitalization  RUPTURED AAA  Diagnoses  Preliminary cause of death: RUPTURED AAA, DM, Hyperlipidemia Secondary Diagnoses (including complications and co-morbidities):  AAA HEMORRHAGIC SHOCK ACUTE RENAL FAILURE METABOLIC ACIDOSIS RETROPERITONEAL HEMMORRHAGE GOALS OF CARE ACUTE BLOOD LOSS ANEMIA   SEPSIS RULED [place] [facility] Course (including significant findings, care, treatment, and services provided and events leading to death)   ACUTE HEMORRHAGIC SHOCK WITH RUPTURED AAA WITH MULTIORGAN FAILURE PATIENT IN DYING PROCESS  2023-07-21 admitted for ruptured AAA LEFT IJ placed 10/8 acute cardiac arrest, RT VASC cath placed, did not tolerate CRRT  10/8 PROCEDURE: 1. US guidance for vascular access, bilateral femoral arteries 2. Catheter placement into aorta from bilateral femoral approaches 3. Placement of a 28 mm x 12 x cm length Gore Excluder Endoprosthesis main body left with a 14 mm diameter by 14 cm length right iliac bridge piece and then a 27 mm diameter by 14 cm length right iliac limb contralateral limb 4. Placement of a 14 mm diameter by 14 cm length left iliac extension limb 5. Placement of a 28 mm diameter proximal aortic cuff 6. ProGlide closure devices bilateral femoral arteries  10/8 post op cardiac and resp failure with shock 07-23-2023 acute cardiac arrest, family called to bedside 07/23/23 patient made DNR, multiple pressors, severe hypoxia  GOALS OF CARE DISCUSSION  The Clinical status was relayed to family in detail.  Updated and notified of patients medical condition.  Patient with Progressive multiorgan failure with very low chance of meaningful recovery despite all  aggressive and optimal medical therapy. Patient is in the Dying  Process associated with Suffering.  Family understands the situation.  They have consented and agreed to DNR.  Family are satisfied with Plan of action and management. All questions answered    Pertinent Labs and Studies  Significant Diagnostic Studies CT ABDOMEN PELVIS WO CONTRAST  Result Date: 2020-07-20 CLINICAL DATA:  Nausea, vomiting EXAM: CT ABDOMEN AND PELVIS WITHOUT CONTRAST TECHNIQUE: Multidetector CT imaging of the abdomen and pelvis was performed following the standard protocol without IV contrast. COMPARISON:  None. FINDINGS: Lower chest: There is asymmetric ground-glass pulmonary infiltrate within the right lung base, nonspecific, possibly infectious or inflammatory in nature. Superimposed minimal subpleural pulmonary fibrosis. The visualized heart and pericardium are unremarkable. Hepatobiliary: The liver is unremarkable. No intra or extrahepatic biliary ductal dilation. Layering high density material within the gallbladder lumen likely represent small gallstones or hyperdense sludge. Pancreas: Unremarkable Spleen: Unremarkable Adrenals/Urinary Tract: There is extensive right perinephric stranding likely representing infiltration related to the retroperitoneal hemorrhage described more fully below. This obscures the right adrenal gland. Left adrenal gland is unremarkable. Kidneys are otherwise unremarkable. Bladder is unremarkable. Stomach/Bowel: The stomach, small bowel, and large bowel are unremarkable. The appendix is not clearly identified and is likely absent. Small free fluid is seen within the pelvis, nonspecific. No free intraperitoneal gas. Vascular/Lymphatic: A fusiform infrarenal abdominal aortic aneurysm is present measuring 7.3 x 7.5 x 9.5 cm in greatest dimension roughly 55 degree posterior angulation at the proximal neck and roughly 85 degree angulation between the aneurysm sac and the right common iliac  artery. There is a large amount of hyperdense material seen anteriorly and right lateral  to the aneurysm sac in keeping with acute hemorrhage within the retroperitoneum secondary to aneurysm rupture. Infiltrating hemorrhagic material extends into the right anterior and posterior pararenal space and subsequently into the right hemipelvis. 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 Extensive superimposed atherosclerotic calcification within the lower extremity arterial inflow. No pathologic adenopathy within the abdomen and pelvis. Reproductive: Uterus and bilateral adnexa are unremarkable. Other: Rectum unremarkable. Musculoskeletal: No acute bone abnormality. IMPRESSION: Ruptured 7.5 cm infrarenal abdominal aortic aneurysm with extensive retroperitoneal hemorrhage. Significant angulation at the juncture of the aneurysm and right common iliac artery. Moderate angulation involving the proximal neck and body of the aneurysm. These results were called by telephone at the time of interpretation on 07/28/2020 at 10:28 pm to provider [facility] , who verbally acknowledged these results. Electronically Signed   By: Fidela Salisbury MD   On: 07/17/2020 22:30   PERIPHERAL VASCULAR CATHETERIZATION  Result Date: 08/01/2020 See op note  DG Chest Port 1 View  Result Date: 07/04/2020 CLINICAL DATA:  Dialysis catheter placement. EXAM: PORTABLE CHEST 1 VIEW COMPARISON:  Same day. FINDINGS: Stable cardiomediastinal silhouette. Endotracheal and nasogastric tubes are unchanged in position. Interval placement of right internal jugular catheter with distal tip in the right atrium. Left internal jugular catheter is unchanged. No pneumothorax is noted. Minimal bibasilar subsegmental atelectasis is noted. No pleural effusion is noted. Bony thorax is unremarkable. IMPRESSION: Interval placement of right internal jugular catheter with distal tip in right atrium. No pneumothorax is noted. Minimal bibasilar  subsegmental atelectasis. Electronically Signed   By: Marijo Conception M.D.   On: 07/09/2020 12:27   DG Chest Port 1 View  Result Date: 07/19/2020 CLINICAL DATA:  Central line placement EXAM: PORTABLE CHEST 1 VIEW COMPARISON:  Earlier today FINDINGS: Left IJ line with tip at the brachiocephalic SVC junction. No new mediastinal widening or pneumothorax. The enteric tube tip is at the stomach. Endotracheal tube tip is at the clavicular heads. Stable low volume chest with mild atelectatic type density. Stable heart size with aortic tortuosity accentuated by rotation. IMPRESSION: 1. New central line with tip near the SVC brachiocephalic junction. No pneumothorax. 2. Otherwise stable. Electronically Signed   By: Monte Fantasia M.D.   On: 07/05/2020 05:05   Portable Chest x-ray  Result Date: 07/08/2020 CLINICAL DATA:  Post intubation.  OG tube placement. EXAM: PORTABLE CHEST 1 VIEW COMPARISON:  October 27, 2014 FINDINGS: The endotracheal tube terminates above the carina. The enteric tube appears to terminate over the gastric body. The heart size is borderline enlarged. There are hazy bilateral airspace opacities, greatest in the right lower lobe. Aortic calcifications are noted. The mediastinum appears somewhat widened which may be projectional are due to low lung volumes. IMPRESSION: 1. Endotracheal tube terminates above the carina. 2. Enteric tube terminates over the gastric body. 3. Hazy bilateral airspace opacities, greatest in the right lower lobe, may represent atelectasis or infiltrates. Electronically Signed   By: Constance Holster M.D.   On: 07/09/2020 02:51   ECHOCARDIOGRAM COMPLETE  Result Date: 07/13/2020    ECHOCARDIOGRAM REPORT   Patient Name:   Isabel Castro Date of Exam: 07/09/2020 Medical Rec #:  [id number]           Height:       63.0 in Accession #:    [id number]          Weight:       185.0 lb Date of Birth:  02-Jan-1951  BSA:          1.871 m Patient Age:    33 years             BP:           106/82 mmHg Patient Gender: F                   HR:           137 bpm. Exam Location:  [facility] Procedure: 2D Echo, Color Doppler and Cardiac Doppler Indications:     Shock  History:         Patient has no prior history of Echocardiogram examinations.                  Risk Factors:Dyslipidemia.  Sonographer:     Charmayne Sheer RDCS (AE) Referring Phys:  [id number] Bradly Bienenstock Diagnosing Phys: Ida Rogue MD  Sonographer Comments: Echo performed with patient supine and on artificial respirator and no subcostal window. Image acquisition challenging due to patient body habitus. IMPRESSIONS  1. Left ventricular ejection fraction, by estimation, is 60 to 65%. The left ventricle has normal function. The left ventricle has no regional wall motion abnormalities. There is severe left ventricular hypertrophy with near cavity obliteration in systole. Left ventricular diastolic parameters are consistent with Grade I diastolic dysfunction (impaired relaxation).  2. Right ventricular systolic function is normal. The right ventricular size is normal. Tricuspid regurgitation signal is inadequate for assessing PA pressure.  3. Sinus tachycardia noted, rate 138 bpm  4. Challenging images FINDINGS  Left Ventricle: Left ventricular ejection fraction, by estimation, is 60 to 65%. The left ventricle has normal function. The left ventricle has no regional wall motion abnormalities. The left ventricular internal cavity size was normal in size. There is  severe concentric left ventricular hypertrophy. Left ventricular diastolic parameters are consistent with Grade I diastolic dysfunction (impaired relaxation). Right Ventricle: The right ventricular size is normal. No increase in right ventricular wall thickness. Right ventricular systolic function is normal. Tricuspid regurgitation signal is inadequate for assessing PA pressure. Left Atrium: Left atrial size was normal in size. Right Atrium: Right atrial size was normal in size.  Pericardium: There is no evidence of pericardial effusion. Mitral Valve: The mitral valve is normal in structure. No evidence of mitral valve regurgitation. No evidence of mitral valve stenosis. MV peak gradient, 5.2 mmHg. The mean mitral valve gradient is 2.0 mmHg. Tricuspid Valve: The tricuspid valve is normal in structure. Tricuspid valve regurgitation is not demonstrated. No evidence of tricuspid stenosis. Aortic Valve: The aortic valve was not well visualized. Aortic valve regurgitation is not visualized. No aortic stenosis is present. Aortic valve mean gradient measures 5.0 mmHg. Aortic valve peak gradient measures 11.0 mmHg. Aortic valve area, by VTI measures 1.94 cm. Pulmonic Valve: The pulmonic valve was normal in structure. Pulmonic valve regurgitation is not visualized. No evidence of pulmonic stenosis. Aorta: The aortic root is normal in size and structure. Venous: The inferior vena cava is normal in size with greater than 50% respiratory variability, suggesting right atrial pressure of 3 mmHg. IAS/Shunts: No atrial level shunt detected by color flow Doppler.  LEFT VENTRICLE PLAX 2D LVIDd:         3.60 cm  Diastology LVIDs:         2.94 cm  LV e' medial:    5.44 cm/s LV PW:         1.00 cm  LV E/e' medial:  9.7 LV IVS:  1.25 cm  LV e' lateral:   6.20 cm/s LVOT diam:     2.20 cm  LV E/e' lateral: 8.5 LV SV:         30 LV SV Index:   16 LVOT Area:     3.80 cm  LEFT ATRIUM             Index LA diam:        3.30 cm 1.76 cm/m LA Vol (A2C):   17.5 ml 9.36 ml/m LA Vol (A4C):   25.6 ml 13.69 ml/m LA Biplane Vol: 22.9 ml 12.24 ml/m  AORTIC VALVE                    PULMONIC VALVE AV Area (Vmax):    2.68 cm     PV Vmax:       1.72 m/s AV Area (Vmean):   2.59 cm     PV Vmean:      114.000 cm/s AV Area (VTI):     1.94 cm     PV VTI:        0.179 m AV Vmax:           166.00 cm/s  PV Peak grad:  11.8 mmHg AV Vmean:          103.000 cm/s PV Mean grad:  6.0 mmHg AV VTI:            0.155 m AV Peak Grad:       11.0 mmHg AV Mean Grad:      5.0 mmHg LVOT Vmax:         117.00 cm/s LVOT Vmean:        70.100 cm/s LVOT VTI:          0.079 m LVOT/AV VTI ratio: 0.51  AORTA Ao Root diam: 2.80 cm MITRAL VALVE MV Area (PHT): 8.43 cm    SHUNTS MV Peak grad:  5.2 mmHg    Systemic VTI:  0.08 m MV Mean grad:  2.0 mmHg    Systemic Diam: 2.20 cm MV Vmax:       1.14 m/s MV Vmean:      66.4 cm/s MV Decel Time: 90 msec MV E velocity: 52.60 cm/s MV A velocity: 89.40 cm/s MV E/A ratio:  0.59 Ida Rogue MD Electronically signed by Ida Rogue MD Signature Date/Time: 08/02/2020/3:23:52 PM    Final     Microbiology Recent Results (from the past 240 hour(s))  Urine Culture     Status: None   Collection Time: 07/24/2020  8:24 PM   Specimen: Urine, Random  Result Value Ref Range Status   Specimen Description   Final    URINE, RANDOM Performed at [facility], [address], [place], [place] [address]    Special Requests   Final    NONE Performed at [facility], [address], [place], [place] [address]    Culture   Final    NO GROWTH Performed at [facility], [place] [address] [place], [place] [address]    Report Status 2020-08-04 FINAL  Final  Resp Panel by RT PCR (RSV, Flu A&B, Covid) -     Status: None   Collection Time: 07/04/2020 11:04 PM  Result Value Ref Range Status   SARS Coronavirus 2 by RT PCR NEGATIVE NEGATIVE Final   Influenza A by PCR NEGATIVE NEGATIVE Final   Influenza B by PCR NEGATIVE NEGATIVE Final   Respiratory Syncytial Virus by PCR NEGATIVE NEGATIVE Final  Comment: Performed at [facility], [address] [place], [place] [address]  MRSA PCR Screening     Status: None   Collection Time: 08/02/2020  2:19 AM   Specimen: Nasopharyngeal  Result Value Ref Range Status   MRSA by PCR NEGATIVE NEGATIVE Final    Comment:        The GeneXpert MRSA Assay (FDA approved for NASAL specimens only), is one component of a comprehensive MRSA  colonization surveillance program. It is not intended to diagnose MRSA infection nor to guide or monitor treatment for MRSA infections. Performed at [facility], [address] [place], [place] [address]   CULTURE, BLOOD (ROUTINE X 2) w Reflex to ID Panel     Status: None (Preliminary result)   Collection Time: 07/09/2020  3:42 AM   Specimen: BLOOD  Result Value Ref Range Status   Specimen Description BLOOD RIGHT HAND  Final   Special Requests   Final    BOTTLES DRAWN AEROBIC ONLY Blood Culture results may not be optimal due to an inadequate volume of blood received in culture bottles   Culture   Final    NO GROWTH <12 HOURS Performed at [facility], [address] [place], [place] [address]    Report Status PENDING  Incomplete  Culture, respiratory     Status: None (Preliminary result)   Collection Time: 07/12/2020  8:41 AM   Specimen: Tracheal Aspirate; Respiratory  Result Value Ref Range Status   Specimen Description   Final    TRACHEAL ASPIRATE Performed at [facility], [address], [place], [place] [address]    Special Requests   Final    NONE Performed at [facility], [address] [place], [place] [address]    Gram Stain   Final    RARE WBC PRESENT,BOTH PMN AND MONONUCLEAR FEW GRAM POSITIVE COCCI IN PAIRS IN CLUSTERS Performed at [facility], [place] [address] [place], [place] [address]    Culture PENDING  Incomplete   Report Status PENDING  Incomplete    Lab Basic Metabolic Panel: Recent Labs  Lab 07/17/2020 0450 07/17/2020 0831 07/25/2020 1552 July 26, 2020 0101 07/26/2020 0453  NA 142 143 147*  146* 143 142  K 5.7* 6.5* 4.2  4.2 6.3* 6.4*  CL 110 107 106  105 103 102  CO2 18* 23 24  24  14* 13*  GLUCOSE 246* 180* 60*  58* 175* 152*  BUN 31* 33* 29*  27* 27* 28*  CREATININE 2.00* 2.28* 2.48*  2.34* 2.93* 3.21*  CALCIUM 6.4* 6.2* 6.1*  6.1* 6.0* 5.7*  MG 1.9  --   --   --  2.0  PHOS  --   --  8.2*  --   13.0*   Liver Function Tests: Recent Labs  Lab 07/28/2020 2024 07/30/2020 0450 07/26/2020 0831 07/30/2020 1552 2020/07/26 0453  AST 28 34 121*  --  >10,000*  ALT 19 20 109*  --  12,415*  ALKPHOS 46 28* 36*  --  63  BILITOT 0.8 0.7 1.1  --  1.4*  PROT 7.0 3.2* 3.6*  --  3.5*  ALBUMIN 3.8 1.8* 2.0* 1.9* 1.6*   Recent Labs  Lab 07/07/2020 2024  LIPASE 40   No results for input(s): AMMONIA in the last 168 hours. CBC: Recent Labs  Lab 07/16/2020 2024 07/30/2020 2024 07/21/2020 0450 07/18/2020 0450 07/08/2020 0826 07/13/2020 0826 07/08/2020 1348 07/28/2020 2007 2020-07-26 0101 26-Jul-2020 0453 Jul 26, 2020 0830  WBC 10.2  --  25.2*  --  24.9*  --   --   --   --  14.2*  --   NEUTROABS  --   --  20.2*  --  20.5*  --   --   --   --  10.6*  --   HGB 9.7*   < > 7.1*   < > 8.5*   < > 7.4* 5.0* 8.7* 9.3* 8.3*  HCT 31.4*   < > 22.6*   < > 25.6*   < > 21.6* 16.2* 27.8* 28.1* 25.5*  MCV 90.8  --  91.5  --  90.8  --   --   --   --  94.9  --   PLT 215  --  96*  --  86*  --   --   --   --  35*  --    < > = values in this interval not displayed.   Cardiac Enzymes: No results for input(s): CKTOTAL, CKMB, CKMBINDEX, TROPONINI in the last 168 hours. Sepsis Labs: Recent Labs  Lab 07/28/2020 2024 07/26/2020 2201 07/08/2020 2300 07/24/2020 0450 07/23/2020 0826 08/01/2020 0831 07/10/20 1510 August 05, 2020 0453  PROCALCITON  --   --   --  <0.10  --   --   --  0.94  WBC 10.2  --   --  25.2* 24.9*  --   --  14.2*  LATICACIDVEN  --    < > 9.1* 10.3*  --  6.5* 8.7*  --    < > = values in this interval not displayed.     Flora Lipps August 05, 2020, 2:53 PM

## 2020-08-03 NOTE — Progress Notes (Signed)
[facility] called to rm. by RN as pt. is actively dying; when [facility] arrived pt. had expired.  [place] provided support to pt.'s dtr. and approx. 7 other family members who arrived to be present at bedside.  Per family's request [place] helped coordinate removal of tubes and leads w/pt.'s RN; Family comforted one another and shared stories of pt. w/[facility]. After some time [place] excused himself to allow family to have time together and w/pt.  No further needs expressed at this time.

## 2020-08-03 NOTE — Progress Notes (Signed)
0800 Unable to doppler right pedal, posterior tibial or popliteal pulses. Katie charge nurse also attempted to doppler pulses without success. Patient unresponsive to any stimuli even pain. Pupils are 4 mm bilaterally and non reactive. Family in room. Re iterated that patient was very sick, on multiple medications, ventilator- maximum life support and would probably not survive.

## 2020-08-03 DEATH — deceased

## 2022-05-01 IMAGING — DX DG CHEST 1V PORT
1 series · 1 of 1 positions shown · non-contrast
Comparison: Earlier today

CLINICAL DATA: Central line placement

EXAM:
PORTABLE CHEST 1 VIEW

[chest ap]
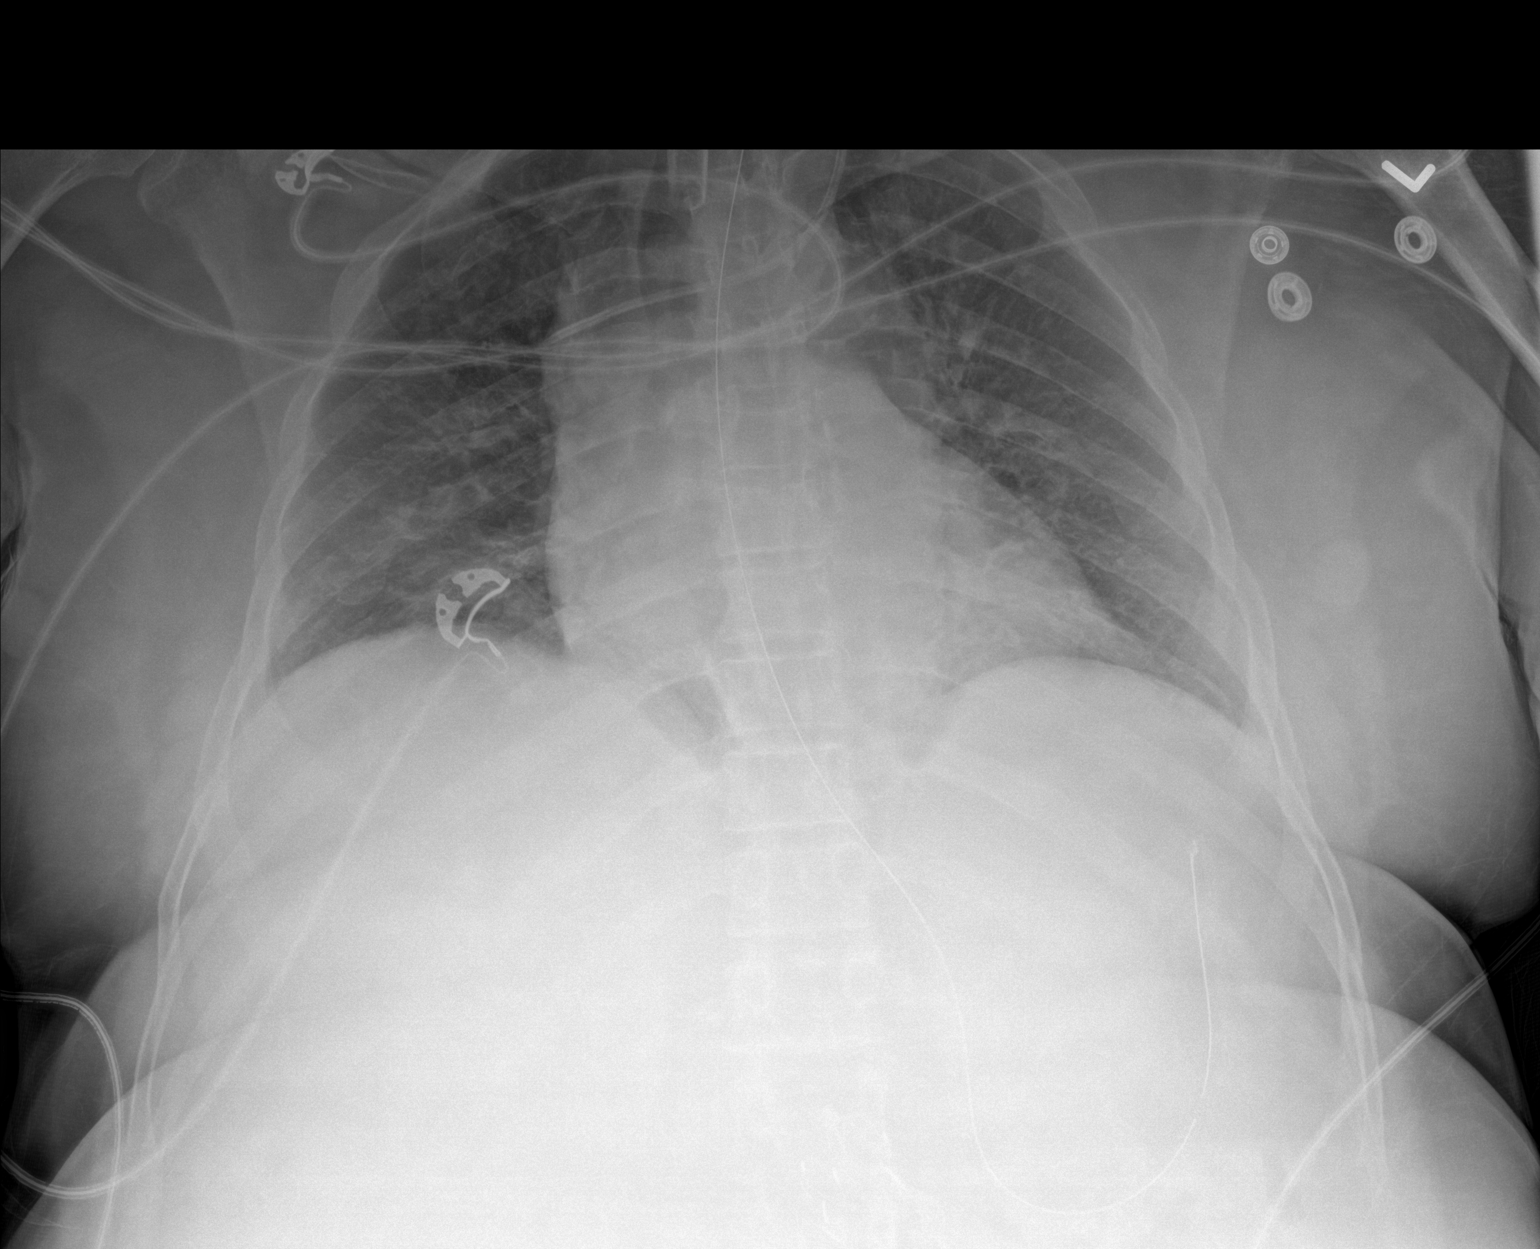

[1 of 1 positions shown; findings below may reference images not displayed]

FINDINGS: Left IJ line with tip at the brachiocephalic SVC junction. No new
mediastinal widening or pneumothorax. The enteric tube tip is at the
stomach. Endotracheal tube tip is at the clavicular heads. Stable
low volume chest with mild atelectatic type density. Stable heart
size with aortic tortuosity accentuated by rotation.
IMPRESSION: 1. New central line with tip near the SVC brachiocephalic junction.
No pneumothorax.
2. Otherwise stable.

## 2022-05-01 IMAGING — DX DG CHEST 1V PORT
2 series · 2 of 2 positions shown · non-contrast
Comparison: October 27, 2014

CLINICAL DATA: Post intubation.  OG tube placement.

EXAM:
PORTABLE CHEST 1 VIEW

[chest ap (1 of 2)]
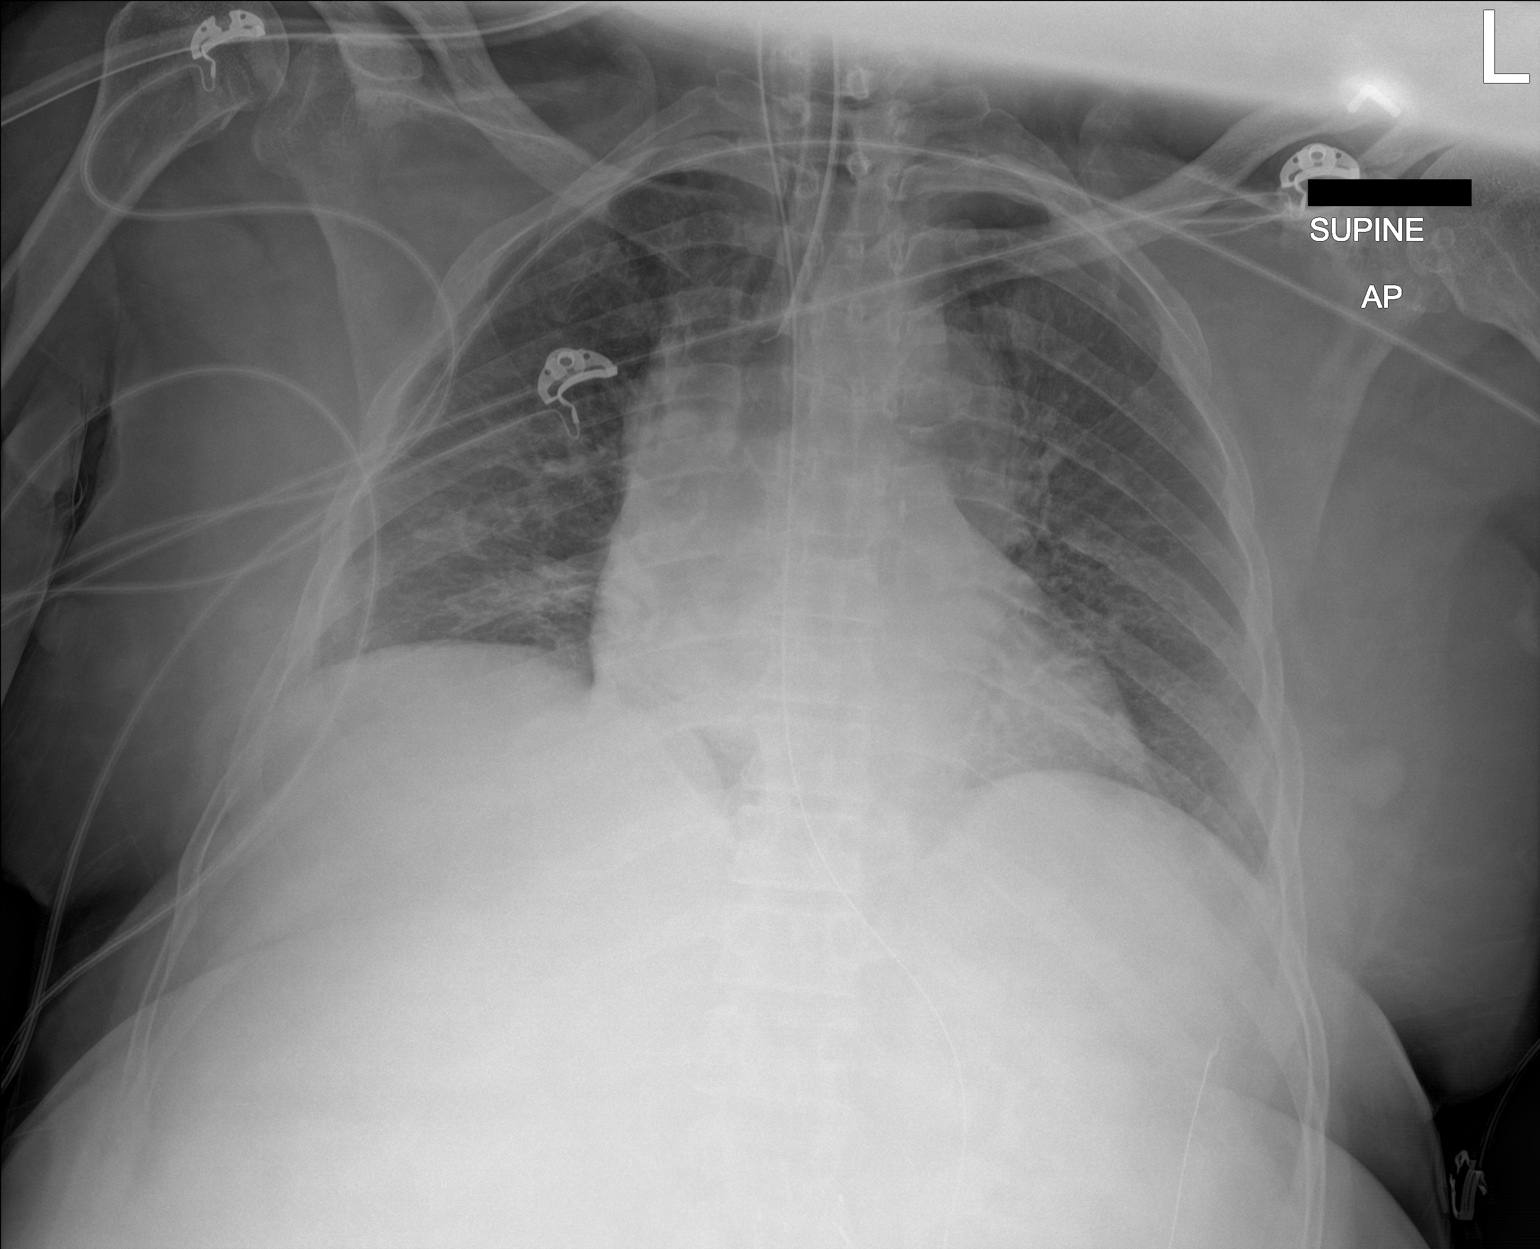

[chest ap (2 of 2)]
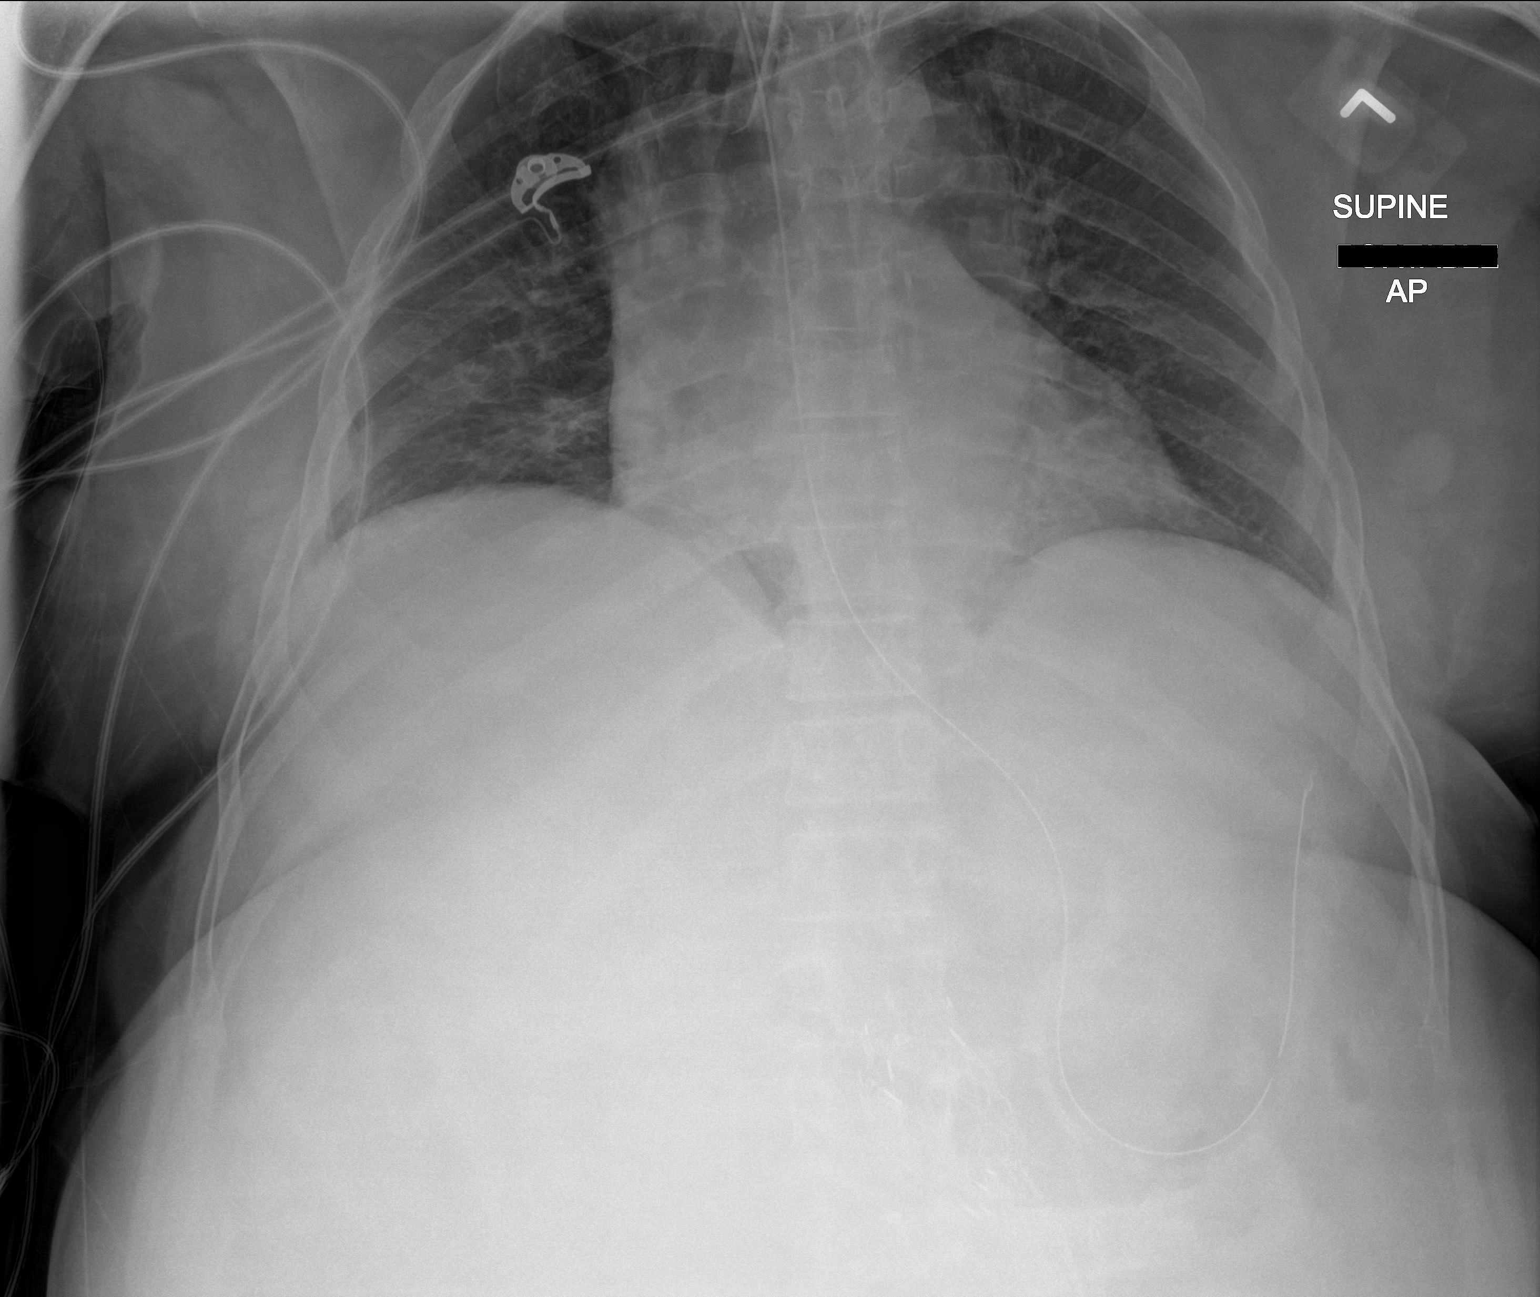

[2 of 2 positions shown; findings below may reference images not displayed]

FINDINGS: The endotracheal tube terminates above the carina. The enteric tube
appears to terminate over the gastric body. The heart size is
borderline enlarged. There are hazy bilateral airspace opacities,
greatest in the right lower lobe. Aortic calcifications are noted.
The mediastinum appears somewhat widened which may be projectional
are due to low lung volumes.
IMPRESSION: 1. Endotracheal tube terminates above the carina.
2. Enteric tube terminates over the gastric body.
3. Hazy bilateral airspace opacities, greatest in the right lower
lobe, may represent atelectasis or infiltrates.
